# Patient Record
Sex: Female | Born: 1955
Health system: Southern US, Community
[De-identification: ages and names within clinical notes are randomized; demographics above are authoritative.]

## PROBLEM LIST (undated history)

## (undated) DIAGNOSIS — E785 Hyperlipidemia, unspecified: Secondary | ICD-10-CM

## (undated) DIAGNOSIS — N200 Calculus of kidney: Secondary | ICD-10-CM

## (undated) DIAGNOSIS — R9389 Abnormal findings on diagnostic imaging of other specified body structures: Secondary | ICD-10-CM

## (undated) DIAGNOSIS — F32A Depression, unspecified: Secondary | ICD-10-CM

## (undated) DIAGNOSIS — I10 Essential (primary) hypertension: Secondary | ICD-10-CM

## (undated) DIAGNOSIS — R5383 Other fatigue: Secondary | ICD-10-CM

## (undated) DIAGNOSIS — R0683 Snoring: Secondary | ICD-10-CM

## (undated) DIAGNOSIS — D251 Intramural leiomyoma of uterus: Principal | ICD-10-CM

## (undated) DIAGNOSIS — E119 Type 2 diabetes mellitus without complications: Secondary | ICD-10-CM

## (undated) DIAGNOSIS — F329 Major depressive disorder, single episode, unspecified: Secondary | ICD-10-CM

## (undated) HISTORY — DX: Major depressive disorder, single episode, unspecified: F32.9

## (undated) HISTORY — DX: Abnormal findings on diagnostic imaging of other specified body structures: R93.89

## (undated) HISTORY — PX: FOOT SURGERY: SHX648

## (undated) HISTORY — DX: Hyperlipidemia, unspecified: E78.5

## (undated) HISTORY — DX: Snoring: R06.83

## (undated) HISTORY — DX: Intramural leiomyoma of uterus: D25.1

## (undated) HISTORY — DX: Calculus of kidney: N20.0

## (undated) HISTORY — DX: Other fatigue: R53.83

## (undated) HISTORY — DX: Depression, unspecified: F32.A

---

## 2000-12-25 ENCOUNTER — Emergency Department (HOSPITAL_COMMUNITY): Admission: EM | Admit: 2000-12-25 | Discharge: 2000-12-25 | Payer: Self-pay | Admitting: Emergency Medicine

## 2000-12-25 ENCOUNTER — Encounter: Payer: Self-pay | Admitting: Emergency Medicine

## 2002-03-30 ENCOUNTER — Encounter (INDEPENDENT_AMBULATORY_CARE_PROVIDER_SITE_OTHER): Payer: Self-pay | Admitting: *Deleted

## 2002-03-30 LAB — CONVERTED CEMR LAB

## 2002-04-21 ENCOUNTER — Encounter: Admission: RE | Admit: 2002-04-21 | Discharge: 2002-04-21 | Payer: Self-pay | Admitting: Family Medicine

## 2002-04-21 ENCOUNTER — Other Ambulatory Visit: Admission: RE | Admit: 2002-04-21 | Discharge: 2002-04-21 | Payer: Self-pay | Admitting: Sports Medicine

## 2002-04-27 ENCOUNTER — Encounter: Admission: RE | Admit: 2002-04-27 | Discharge: 2002-04-27 | Payer: Self-pay | Admitting: Sports Medicine

## 2002-09-04 ENCOUNTER — Encounter: Admission: RE | Admit: 2002-09-04 | Discharge: 2002-09-04 | Payer: Self-pay | Admitting: Family Medicine

## 2003-02-23 ENCOUNTER — Encounter: Admission: RE | Admit: 2003-02-23 | Discharge: 2003-02-23 | Payer: Self-pay | Admitting: Family Medicine

## 2003-02-26 ENCOUNTER — Ambulatory Visit (HOSPITAL_COMMUNITY): Admission: RE | Admit: 2003-02-26 | Discharge: 2003-02-26 | Payer: Self-pay | Admitting: Family Medicine

## 2003-03-10 ENCOUNTER — Encounter: Admission: RE | Admit: 2003-03-10 | Discharge: 2003-03-10 | Payer: Self-pay | Admitting: Family Medicine

## 2003-03-19 ENCOUNTER — Encounter: Admission: RE | Admit: 2003-03-19 | Discharge: 2003-03-19 | Payer: Self-pay | Admitting: Sports Medicine

## 2003-03-22 ENCOUNTER — Encounter: Admission: RE | Admit: 2003-03-22 | Discharge: 2003-03-22 | Payer: Self-pay | Admitting: Sports Medicine

## 2003-07-28 ENCOUNTER — Encounter: Admission: RE | Admit: 2003-07-28 | Discharge: 2003-07-28 | Payer: Self-pay | Admitting: Family Medicine

## 2004-03-08 ENCOUNTER — Encounter: Admission: RE | Admit: 2004-03-08 | Discharge: 2004-03-08 | Payer: Self-pay | Admitting: Family Medicine

## 2004-07-12 ENCOUNTER — Ambulatory Visit: Payer: Self-pay | Admitting: Sports Medicine

## 2004-12-27 ENCOUNTER — Emergency Department (HOSPITAL_COMMUNITY): Admission: EM | Admit: 2004-12-27 | Discharge: 2004-12-27 | Payer: Self-pay | Admitting: Family Medicine

## 2006-09-26 DIAGNOSIS — E669 Obesity, unspecified: Secondary | ICD-10-CM | POA: Insufficient documentation

## 2006-09-27 ENCOUNTER — Encounter (INDEPENDENT_AMBULATORY_CARE_PROVIDER_SITE_OTHER): Payer: Self-pay | Admitting: *Deleted

## 2007-03-12 ENCOUNTER — Ambulatory Visit (HOSPITAL_BASED_OUTPATIENT_CLINIC_OR_DEPARTMENT_OTHER): Admission: RE | Admit: 2007-03-12 | Discharge: 2007-03-12 | Payer: Self-pay | Admitting: Orthopedic Surgery

## 2008-01-10 ENCOUNTER — Emergency Department (HOSPITAL_COMMUNITY): Admission: EM | Admit: 2008-01-10 | Discharge: 2008-01-10 | Payer: Self-pay | Admitting: Emergency Medicine

## 2010-12-12 NOTE — Op Note (Signed)
[name], [name]               ACCOUNT NO.:  [id number]   MEDICAL RECORD NO.:  [id number]          PATIENT TYPE:  AMB   LOCATION:  DSC                          FACILITY:  [facility]   PHYSICIAN:  Leonides Grills, M.D.     DATE OF BIRTH:  [birth year]   DATE OF PROCEDURE:  03/12/2007  DATE OF DISCHARGE:                               OPERATIVE REPORT   PREOPERATIVE DIAGNOSES:  1. Left ankle impingement.  2. Left tight gastroc.   POSTOPERATIVE DIAGNOSES:  1. Left ankle impingement.  2. Left tight gastroc.   OPERATION:  1. Left ankle arthroscopy with extensive debridement.  2. Left gastroc slide.   ANESTHESIA:  General.   SURGEON:  Leonides Grills, MD   ASSISTANT:  Evlyn Kanner, P.A.   ESTIMATED BLOOD LOSS:  Minimal.   TOURNIQUET:  None.   COMPLICATIONS:  None.   DISPOSITION:  Stable to the PR.   INDICATIONS:  This is a 55 year old female who had a work-related injury  and required the above surgery.  All risks which include infection,  nerve vessel injury, persistent pain, worse pain, prolonged recovery,  stiffness, arthritis, sinus formation, synovial cyst formation were all  explained questions encouraged answered.  We did not perform any  forefoot surgery at this point due to the fact that it was not covered  by the workman's comp carrier and the patient did not want to proceed  with it if it was not covered by the workman's comp carrier.   OPERATION:  The patient brought to the operating room, placed in supine  position after adequate general endotracheal tube anesthesia was  administered as well as Ancef 1 gram IV piggyback.  The patient was then  placed in a sloppy lateral position with the operative side up on a  beanbag.  All bony prominences well padded.  Left lower extremity was  prepped and draped in sterile manner.  No tourniquet was used.  A  longitudinal incision over the medial aspect of the gastrocnemius muscle  tendinous junction was then made.  Dissection  was carried down through  skin.  Hemostasis was obtained.  Fascia was opened in line with the  incision.  Conjoint region was then developed between the gastroc and  soleus.  Soft tissue was then elevated off the posterior aspect the  gastrocnemius.  Sural nerve was identified and protected posteriorly.  Gastrocnemius was then released with curved Mayo scissors, this had  excellent release tight gastroc.  Area was copiously irrigated normal  saline.  Subcu was closed 3-0 Vicryl.  The skin was closed 4-0 Monocryl  subcuticular stitch.  Steri-Strips were applied.  We then mapped out the  anatomical landmarks to include anterior tibialis and peroneus tertius.  Superficial peroneal nerve could not be seen.  Spinal needle was then  placed into the ankle and 20 mL of normal saline instilled into the  ankle.  Weston Brass and spread technique was then utilized to create the  anteromedial portal medial to the anterior tibialis tendon.  Blunt tip  trocar with cannula followed by camera was then placed into the ankle  and  under direct visualization anterolateral portal was created lateral  to peroneus tertius tendon illuminating the skin to create interval  where the superficial peroneal nerve could not be seen.  Again this was  done with a spinal needle followed by nick spread technique.  There was  a large amount of synovitis anterolaterally.  The accessory tib-fib  ligament was impinging against the talar dome. An extensive debridement  was then performed with a radiofrequency bevel removing not only the  accessory tib-fib ligament but also the synovitis that extended into the  lateral gutter as well.  Pictures were obtained.  We then placed a  camera anterolaterally, visualizing anteromedially.  There was a small  amount of synovitis in this area and moderate extending into the gutter  region.  Again this was also extensively debrided ranging the ankle,  there was no impinging areas, i.e. deep deltoid  ligament.  There was no  osteochondral lesions that were obvious. Pictures were obtained as well.  Camera was removed.  Wound was closed 4-0 nylon stitch.  Sterile  dressing was applied, Cam walker boot was applied.  The patient was  stable to PR.      Leonides Grills, M.D.  Electronically Signed     PB/MEDQ  D:  03/12/2007  T:  03/13/2007  Job:  [id number]

## 2011-04-26 LAB — CBC
HCT: 37
Hemoglobin: 12.5
Hemoglobin: 12.5
MCHC: 33.9
MCHC: 34.3
MCV: 83.2
MCV: 83.7
Platelets: 239
RBC: 4.42
RDW: 13.7
RDW: 14.2
WBC: 10.7 — ABNORMAL HIGH

## 2011-04-26 LAB — DIFFERENTIAL
Basophils Absolute: 0
Basophils Relative: 0
Eosinophils Absolute: 0
Monocytes Absolute: 1
Neutro Abs: 8.4 — ABNORMAL HIGH

## 2011-04-26 LAB — BASIC METABOLIC PANEL
BUN: 6
CO2: 24
Calcium: 9.2
Chloride: 103
Creatinine, Ser: 1.06
GFR calc Af Amer: >60
GFR calc non Af Amer: 54 — ABNORMAL LOW
Glucose, Bld: 170 — ABNORMAL HIGH
Potassium: 3.1 — ABNORMAL LOW
Sodium: 133 — ABNORMAL LOW

## 2011-04-26 LAB — POCT CARDIAC MARKERS: CKMB, poc: 1.2

## 2011-04-28 ENCOUNTER — Emergency Department (HOSPITAL_COMMUNITY): Payer: Self-pay

## 2011-04-28 ENCOUNTER — Encounter: Payer: Self-pay | Admitting: *Deleted

## 2011-04-28 ENCOUNTER — Emergency Department (HOSPITAL_COMMUNITY)
Admission: EM | Admit: 2011-04-28 | Discharge: 2011-04-28 | Disposition: A | Discharge status: Home / Self Care | Payer: Self-pay | Attending: Emergency Medicine | Admitting: Emergency Medicine

## 2011-04-28 DIAGNOSIS — S99929A Unspecified injury of unspecified foot, initial encounter: Secondary | ICD-10-CM | POA: Insufficient documentation

## 2011-04-28 DIAGNOSIS — S9031XA Contusion of right foot, initial encounter: Secondary | ICD-10-CM

## 2011-04-28 DIAGNOSIS — S8990XA Unspecified injury of unspecified lower leg, initial encounter: Secondary | ICD-10-CM | POA: Insufficient documentation

## 2011-04-28 DIAGNOSIS — W2209XA Striking against other stationary object, initial encounter: Secondary | ICD-10-CM | POA: Insufficient documentation

## 2011-04-28 DIAGNOSIS — S9030XA Contusion of unspecified foot, initial encounter: Secondary | ICD-10-CM | POA: Insufficient documentation

## 2011-04-28 DIAGNOSIS — T148XXA Other injury of unspecified body region, initial encounter: Secondary | ICD-10-CM

## 2011-04-28 HISTORY — DX: Essential (primary) hypertension: I10

## 2011-04-28 MED ORDER — BACITRACIN ZINC 500 UNIT/GM EX OINT
TOPICAL_OINTMENT | CUTANEOUS | Status: AC
  Administered 2011-04-28: 19:00:00
  Filled 2011-04-28: qty 0.9

## 2011-04-28 MED ORDER — HYDROCODONE-ACETAMINOPHEN 5-325 MG PO TABS
1.0000 | ORAL_TABLET | Freq: Once | ORAL | Status: AC
  Administered 2011-04-28: 1 via ORAL
  Filled 2011-04-28: qty 1

## 2011-04-28 MED ORDER — HYDROCODONE-ACETAMINOPHEN 5-325 MG PO TABS: ORAL_TABLET | ORAL | Status: DC

## 2011-04-28 MED ORDER — TETANUS-DIPHTHERIA TOXOIDS TD 5-2 LFU IM INJ
INJECTION | INTRAMUSCULAR | Status: AC
  Administered 2011-04-28: 0.5 mL via INTRAMUSCULAR
  Filled 2011-04-28: qty 0.5

## 2011-04-28 MED ORDER — IBUPROFEN 800 MG PO TABS
800.0000 mg | ORAL_TABLET | Freq: Once | ORAL | Status: AC
  Administered 2011-04-28: 800 mg via ORAL
  Filled 2011-04-28: qty 1

## 2011-04-28 NOTE — ED Notes (Signed)
Wound cleaned, and gauze applied.  Pt provided with crutches and teaching given.  Return demonstration of proper use.  Denies any questions.

## 2011-04-28 NOTE — ED Provider Notes (Signed)
History     CSN: [id number] Arrival date & time: 04/28/2011  4:51 PM  Chief Complaint  Patient presents with  . Foot Injury    (Consider location/radiation/quality/duration/timing/severity/associated sxs/prior treatment) HPI Comments: Was walking down her hallway this AM and the heater vent "popped up" and she struck her R foot on it.  She worked all day and walked on it.  She went somewhere after work and after sitting for a while has been unable to  [employer] any weight.  Patient is a 55 y.o. female presenting with foot injury. The history is provided by the patient. No language interpreter was used.  Foot Injury  The incident occurred 6 to 12 hours ago. The incident occurred at home. The injury mechanism was a direct blow. The pain is present in the right foot. The quality of the pain is described as throbbing. The pain is moderate. The pain has been constant since onset. Associated symptoms comments: Pain and swelling.  Superficial break in skin.. She reports no foreign bodies present. The symptoms are aggravated by bearing weight and palpation. She has tried nothing for the symptoms.    Past Medical History  Diagnosis Date  . Hypertension     Past Surgical History  Procedure Date  . Cesarean section   . Foot surgery     History reviewed. No pertinent family history.  History  Substance Use Topics  . Smoking status: Never Smoker   . Smokeless tobacco: Not on file  . Alcohol Use: No    OB History    Grav Para Term Preterm Abortions TAB SAB Ect Mult Living                  Review of Systems  Skin: Positive for wound.       Swelling, pain and PT to foot.  All other systems reviewed and are negative.    Allergies  Review of patient's allergies indicates no known allergies.  Home Medications  No current outpatient prescriptions on file.  BP 154/90  Pulse 73  Temp(Src) 98.6 F (37 C) (Oral)  Resp 16  SpO2 100%  Physical Exam  Nursing note and vitals  reviewed. Constitutional: She is oriented to person, place, and time. Vital signs are normal. She appears well-developed and well-nourished. No distress.  HENT:  Head: Normocephalic and atraumatic.  Right Ear: External ear normal.  Left Ear: External ear normal.  Mouth/Throat: No oropharyngeal exudate.  Eyes: Conjunctivae and EOM are normal. Pupils are equal, round, and reactive to light. Right eye exhibits no discharge. Left eye exhibits no discharge. No scleral icterus.  Neck: Normal range of motion. Neck supple. No JVD present. No tracheal deviation present. No thyromegaly present.  Cardiovascular: Normal rate, regular rhythm, intact distal pulses and normal pulses.  Exam reveals friction rub.   Pulmonary/Chest: Effort normal and breath sounds normal. No respiratory distress. She has no wheezes. She has no rales. She exhibits no tenderness.  Abdominal: Soft. Normal appearance and bowel sounds are normal. She exhibits no distension and no mass. There is no tenderness. There is no rebound and no guarding.  Musculoskeletal: She exhibits no edema and no tenderness.       Right foot: She exhibits decreased range of motion, tenderness, bony tenderness, swelling and laceration. She exhibits normal capillary refill, no crepitus and no deformity.       Feet:  Lymphadenopathy:    She has no cervical adenopathy.  Neurological: She is alert and oriented to person, place, and  time. She has normal reflexes. Coordination normal. GCS eye subscore is 4. GCS verbal subscore is 5. GCS motor subscore is 6.  Skin: Skin is warm and dry. No rash noted. She is not diaphoretic.  Psychiatric: She has a normal mood and affect. Her speech is normal and behavior is normal. Judgment and thought content normal. Cognition and memory are normal.    ED Course  Procedures (including critical care time)  Labs Reviewed - No data to display Dg Foot Complete Right  04/28/2011  *RADIOLOGY REPORT*  Clinical Data: Pain post  injury  RIGHT FOOT COMPLETE - 3+ VIEW  Comparison: None.  Findings: Three views of the right foot submitted.  No acute fracture or subluxation.  Soft tissue swelling noted dorsal metatarsal region.  IMPRESSION: No acute fracture or subluxation.  Soft tissue swelling dorsally.  Original Report Authenticated By: Natasha Mead, M.D.     No diagnosis found.    MDM          Worthy Rancher, PA 04/28/11 1907

## 2011-04-28 NOTE — ED Notes (Signed)
Pt c/o pain to right foot. Pt states she cut her foot on floor vent in her house.

## 2011-04-28 NOTE — ED Notes (Signed)
Pt alert and oriented x 3. Skin warm and dry. Color pink. Pt has swelling to the top of her right foot. Small abrasion noted on top of foot. No bleeding at this time. Ice pack to right foot. Right foot elevated on pillow.

## 2011-04-28 NOTE — ED Notes (Signed)
Pt reports continued pain in foot.  Ice pack applied, foot elevated.  Small scrape noted on top of foot. Mild swelling noted. No distress noted.  Warm blanket given.  Pt denies additional needs at present time.

## 2011-04-29 NOTE — ED Provider Notes (Signed)
Medical screening examination/treatment/procedure(s) were performed by non-physician practitioner and as supervising physician I was immediately available for consultation/collaboration.  Flint Melter, MD 04/29/11 Lyda Jester

## 2012-04-17 DIAGNOSIS — R42 Dizziness and giddiness: Secondary | ICD-10-CM

## 2013-12-11 ENCOUNTER — Emergency Department (HOSPITAL_COMMUNITY)
Admission: EM | Admit: 2013-12-11 | Discharge: 2013-12-11 | Disposition: A | Discharge status: Home / Self Care | Payer: BC Managed Care – PPO | Attending: Emergency Medicine | Admitting: Emergency Medicine

## 2013-12-11 ENCOUNTER — Encounter (HOSPITAL_COMMUNITY): Payer: Self-pay | Admitting: Emergency Medicine

## 2013-12-11 DIAGNOSIS — I10 Essential (primary) hypertension: Secondary | ICD-10-CM | POA: Insufficient documentation

## 2013-12-11 DIAGNOSIS — E119 Type 2 diabetes mellitus without complications: Secondary | ICD-10-CM | POA: Insufficient documentation

## 2013-12-11 DIAGNOSIS — W1809XA Striking against other object with subsequent fall, initial encounter: Secondary | ICD-10-CM | POA: Insufficient documentation

## 2013-12-11 DIAGNOSIS — S1093XA Contusion of unspecified part of neck, initial encounter: Principal | ICD-10-CM

## 2013-12-11 DIAGNOSIS — Y939 Activity, unspecified: Secondary | ICD-10-CM | POA: Insufficient documentation

## 2013-12-11 DIAGNOSIS — S0003XA Contusion of scalp, initial encounter: Secondary | ICD-10-CM | POA: Insufficient documentation

## 2013-12-11 DIAGNOSIS — Y929 Unspecified place or not applicable: Secondary | ICD-10-CM | POA: Insufficient documentation

## 2013-12-11 DIAGNOSIS — S0083XA Contusion of other part of head, initial encounter: Secondary | ICD-10-CM | POA: Insufficient documentation

## 2013-12-11 HISTORY — DX: Type 2 diabetes mellitus without complications: E11.9

## 2013-12-11 LAB — CBG MONITORING, ED: Glucose-Capillary: 163 mg/dL — ABNORMAL HIGH (ref 70–99)

## 2013-12-11 MED ORDER — HYDROCODONE-ACETAMINOPHEN 5-325 MG PO TABS
1.0000 | ORAL_TABLET | ORAL | Status: DC | PRN
Start: 2013-12-11 — End: 2014-11-29

## 2013-12-11 NOTE — Discharge Instructions (Signed)
He examination is negative for acute changes at this time. Your blood pressure is slightly elevated. Please have this rechecked by your primary provider. Please use Tylenol for mild headache pain, use Norco for more severe pain or soreness. Please return to the emergency department if any unusual changes, problems, or concerns. Facial or Scalp Contusion  A facial or scalp contusion is a deep bruise on the face or head. Contusions happen when an injury causes bleeding under the skin. Signs of bruising include pain, puffiness (swelling), and discolored skin. The contusion may turn blue, purple, or yellow. HOME CARE  Only take medicines as told by your doctor.  Put ice on the injured area.  Put ice in a plastic bag.  Place a towel between your skin and the bag.  Leave the ice on for 20 minutes, 2 3 times a day. GET HELP IF:  You have bite problems.  You have pain when chewing.  You are worried about your face not healing normally. GET HELP RIGHT AWAY IF:   You have severe pain or a headache and medicine does not help.  You are very tired or confused, or your personality changes.  You throw up (vomit).  You have a nosebleed that will not stop.  You see two of everything (double vision) or have blurry vision.  You have fluid coming from your nose or ear.  You have problems walking or using your arms or legs. MAKE SURE YOU:   Understand these instructions.  Will watch your condition.  Will get help right away if you are not doing well or get worse. Document Released: 07/05/2011 Document Revised: 05/06/2013 Document Reviewed: 02/26/2013 [facility] Patient Information 2014 [place], [place].

## 2013-12-11 NOTE — ED Provider Notes (Signed)
CSN: [id number]     Arrival date & time 12/11/13  0801 History   First MD Initiated Contact with Patient 12/11/13 [id number]     Chief Complaint  Patient presents with  . Fall     (Consider location/radiation/quality/duration/timing/severity/associated sxs/prior Treatment) HPI Comments: Patient is a 58 year old female who presents to the emergency department with a complaint of headache and feeling funny. The patient states that 2 nights ago she fell off of aching size bed and hit the floor. The patient states she did not lose consciousness. She was able to get up and walk around after the incident. She has had however a" light headache" since the accident. She states today she had headache, sensation of feeling funny, and some pain when she is attempting to chew on the right side. She became concerned for possible stroke or other problems and came to the emergency department. The patient has not had any loss of bowel or bladder function. She has not had any difficulty with her walking. She does state however that at times she feels as though she may be a little bit off balance. She denies being on any anticoagulation medications. There is no history of any bleeding disorders. She has taken Tylenol for headache, but these have not been successful.  Patient is a 58 y.o. female presenting with fall. The history is provided by the patient.  Fall Associated symptoms include headaches. Pertinent negatives include no abdominal pain, arthralgias, chest pain, coughing or neck pain.    Past Medical History  Diagnosis Date  . Hypertension   . Diabetes mellitus without complication    Past Surgical History  Procedure Laterality Date  . Cesarean section    . Foot surgery     No family history on file. History  Substance Use Topics  . Smoking status: Never Smoker   . Smokeless tobacco: Not on file  . Alcohol Use: No   OB History   Grav Para Term Preterm Abortions TAB SAB Ect Mult Living                  Review of Systems  Constitutional: Negative for activity change.       All ROS Neg except as noted in HPI  HENT: Positive for facial swelling. Negative for nosebleeds.   Eyes: Negative for photophobia and discharge.  Respiratory: Negative for cough, shortness of breath and wheezing.   Cardiovascular: Negative for chest pain and palpitations.  Gastrointestinal: Negative for abdominal pain and blood in stool.  Genitourinary: Negative for dysuria, frequency and hematuria.  Musculoskeletal: Negative for arthralgias, back pain and neck pain.  Skin: Negative.   Neurological: Positive for light-headedness and headaches. Negative for dizziness, seizures and speech difficulty.  Psychiatric/Behavioral: Negative for hallucinations and confusion.      Allergies  Review of patient's allergies indicates no known allergies.  Home Medications   Prior to Admission medications   Medication Sig Start Date End Date Taking? Authorizing Provider  HYDROcodone-acetaminophen [facility]) 5-325 MG per tablet One po q 4-6 hrs prn pain 04/28/11   Richard Sabra Heck, PA-C   BP 155/84  Pulse 124  Temp(Src) 98 F (36.7 C) (Oral)  Resp 20  Ht 5' (1.524 m)  Wt 210 lb (95.255 kg)  BMI 41.01 kg/m2  SpO2 100% Physical Exam  Nursing note and vitals reviewed. Constitutional: She is oriented to person, place, and time. She appears well-developed and well-nourished.  Non-toxic appearance.  HENT:  Head: Normocephalic.  Right Ear: Tympanic membrane and external ear  normal.  Left Ear: Tympanic membrane and external ear normal.  There is a bruise to the right lateral face. There is no crepitus or deformity noted of the temporal mandibular joints on the right or left. There is no deformity of the mandible.  The oropharynx is clear. There is no trauma to the tongue. There is no chipped teeth. There no loose teeth.  There is no bruise, redness, or tenderness behind the right ear. There is a negative Battle's sign  bilaterally.  Eyes: EOM and lids are normal. Pupils are equal, round, and reactive to light.  Neck: Normal range of motion. Neck supple. Carotid bruit is not present.  Cardiovascular: Normal rate, regular rhythm, normal heart sounds, intact distal pulses and normal pulses.   Pulmonary/Chest: Breath sounds normal. No respiratory distress.  There is symmetrical rise and fall of the chest. The patient speaks in complete sentences.  Abdominal: Soft. Bowel sounds are normal. There is no tenderness. There is no guarding.  Musculoskeletal: Normal range of motion.  Mild to moderate puffiness of the feet and ankles.  Lymphadenopathy:       Head (right side): No submandibular adenopathy present.       Head (left side): No submandibular adenopathy present.    She has no cervical adenopathy.  Neurological: She is alert and oriented to person, place, and time. She has normal strength. No cranial nerve deficit or sensory deficit. She exhibits normal muscle tone. Coordination normal.  No motor or sensory deficits appreciated on examination. Gait is steady and intact.  Speech is clear,  patient can count and reason without problem.  Skin: Skin is warm and dry.  Psychiatric: She has a normal mood and affect. Her speech is normal.    ED Course  Procedures (including critical care time) Labs Review Labs Reviewed - No data to display  Imaging Review No results found.   EKG Interpretation None      MDM Patient sustained a fall from aching size bed 2 nights ago hitting her head on a nightstand and on the floor. There was no loss of consciousness. Since the time of the fall there's been no additional falls. There's been no nausea vomiting,, and there's been no vision changes. The patient has had a sensation of being off balance and lightheaded at times and continues to have problem with some mild headache on the right side.  The examination showed the vital signs to be within normal limits with  exception of the pulse rate being 124 on admission and blood pressure being 155/84 on admission. After the patient got back to the room and settle down her heart rate is now 68-71. The pulse oximetry is 100% on room air. Within normal limits by my interpretation. There no gross neurologic deficits appreciated on the examination. I find no evidence of deformity of the facial bones.  The plan at this time is for the patient to use caution in changing positions. I've also asked the patient to use Tylenol for mild headache pain, to use Norco every 4 hours for more severe headache pain. The patient is also to keep a very close watch on her glucose. She is advised to return to ED if any changes or problem.    Final diagnoses:  None    **I have reviewed nursing notes, vital signs, and all appropriate lab and imaging results for this patient.Lenox Ahr, PA-C 12/12/13 1349

## 2013-12-11 NOTE — ED Notes (Signed)
Pt alert & oriented x4, stable gait. Patient given discharge instructions, paperwork & prescription(s). Patient  instructed to stop at the registration desk to finish any additional paperwork. Patient verbalized understanding. Pt left department w/ no further questions. 

## 2013-12-11 NOTE — ED Notes (Signed)
Pt rolled out of bed x 2 nights ago hitting head on vase.  Denies LOC.  C/o headaches, swelling, and "feeling funny" since.  Reports being off balance.  Ambulatory to room with steady gait.  Also c/o right side of face pain and neck pain.

## 2013-12-17 NOTE — ED Provider Notes (Signed)
Medical screening examination/treatment/procedure(s) were performed by non-physician practitioner and as supervising physician I was immediately available for consultation/collaboration.   EKG Interpretation None        Tanna Furry, MD 12/17/13 0045

## 2014-11-08 ENCOUNTER — Other Ambulatory Visit (HOSPITAL_COMMUNITY): Payer: Self-pay | Admitting: Family Medicine

## 2014-11-08 DIAGNOSIS — Z139 Encounter for screening, unspecified: Secondary | ICD-10-CM

## 2014-11-16 ENCOUNTER — Ambulatory Visit (HOSPITAL_COMMUNITY)
Admission: RE | Admit: 2014-11-16 | Discharge: 2014-11-16 | Disposition: A | Discharge status: Home / Self Care | Payer: BLUE CROSS/BLUE SHIELD | Source: Ambulatory Visit | Attending: Family Medicine | Admitting: Family Medicine

## 2014-11-16 DIAGNOSIS — E559 Vitamin D deficiency, unspecified: Secondary | ICD-10-CM | POA: Insufficient documentation

## 2014-11-16 DIAGNOSIS — Z13820 Encounter for screening for osteoporosis: Secondary | ICD-10-CM | POA: Diagnosis present

## 2014-11-16 DIAGNOSIS — Z139 Encounter for screening, unspecified: Secondary | ICD-10-CM

## 2014-11-29 ENCOUNTER — Telehealth: Payer: Self-pay

## 2014-11-29 NOTE — Telephone Encounter (Signed)
Left message to call us back and let us know if she has had a prior sleep study done and where?

## 2014-11-30 ENCOUNTER — Encounter: Payer: Self-pay | Admitting: Neurology

## 2014-11-30 ENCOUNTER — Ambulatory Visit (INDEPENDENT_AMBULATORY_CARE_PROVIDER_SITE_OTHER): Payer: BLUE CROSS/BLUE SHIELD | Admitting: Neurology

## 2014-11-30 VITALS — BP 143/93 | HR 61 | Resp 16 | Ht 60.0 in | Wt 210.0 lb

## 2014-11-30 DIAGNOSIS — E669 Obesity, unspecified: Secondary | ICD-10-CM | POA: Diagnosis not present

## 2014-11-30 DIAGNOSIS — G4733 Obstructive sleep apnea (adult) (pediatric): Secondary | ICD-10-CM | POA: Diagnosis not present

## 2014-11-30 DIAGNOSIS — R351 Nocturia: Secondary | ICD-10-CM

## 2014-11-30 DIAGNOSIS — G4719 Other hypersomnia: Secondary | ICD-10-CM | POA: Diagnosis not present

## 2014-11-30 NOTE — Progress Notes (Addendum)
Subjective:    Patient ID: Cachet Mccutchen is a 59 y.o. female.  HPI     Star Age, MD, PhD [facility] [address] [place], [place] [address]  Dear Dr. Ethlyn Gallery,   I saw your patient, Monia Timmers, upon your kind request in my neurologic clinic today for initial consultation of her sleep disorder, in particular, concern for underlying obstructive sleep apnea. The patient is accompanied by her husband today. As you know, Ms. [name] is a 59 year old right-handed woman with an underlying medical history of hypertension, vitamin D deficiency, depression and obesity, who is reported to snore, have apneic pauses in her sleep and a sense of gasping at night. She was diagnosed with obstructive sleep apnea in the past, some 7 or 8 years ago. She was given a CPAP machine, but only used it for some weeks. I do not have prior sleep study results available for review. I reviewed your office note from 10/04/14, which you kindly included. She had blood work at your office on 10/04/2014 including CBC with differential, CMP, hemoglobin A1c, lipid panel, TSH, B12 and vitamin D, we will request results of those. She reports that her hemoglobin A1c was 10 point something and she was started on a new diabetes medication. 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 She got married earlier this year and she and her husband adopted their 87 year old foster child. She is expecting to foster children soon. Her husband works third shift. Her main complaint is excessive daytime somnolence. She would be willing to have another sleep study and be treated for obstructive sleep apnea with CPAP machine. She has occasional morning headaches. She has nocturia typically 2-3 times per night. She is trying to lose weight. She has 2 grown biological daughters. Her bedtime is around 11 PM and her rise time varies. She does  not wake up rested. Her Epworth sleepiness score is 12 out of 24 and her fatigue score is 43 today. She is a nonsmoker. She does not drink alcohol. She drinks 3 sodas daily typically and 1 cup of coffee in the mornings. Her Past Medical History Is Significant For: Past Medical History  Diagnosis Date  . Hypertension   . Diabetes mellitus without complication   . Fatigue   . Snoring     Her Past Surgical History Is Significant For: Past Surgical History  Procedure Laterality Date  . Cesarean section    . Foot surgery      Her Family History Is Significant For: Family History  Problem Relation Age of Onset  . Kidney disease Mother   . Hypertension Mother   . Diabetes Mother     Her Social History Is Significant For: History   Social History  . Marital Status: Married    Spouse Name: N/A  . Number of Children: 3  . Years of Education: 12th grd   Occupational History  . [employer] Mother     Social History Main Topics  . Smoking status: Never Smoker   . Smokeless tobacco: Not on file  . Alcohol Use: No  . Drug Use: No  . Sexual Activity: Not on file   Other Topics Concern  . None   Social History Narrative   1 cup of coffee a day, occasional soda consumption     Her Allergies Are:  No Known Allergies:   Her Current Medications Are:  Outpatient Encounter Prescriptions as of 11/30/2014  Medication Sig  . lisinopril (PRINIVIL,ZESTRIL) 2.5 MG tablet Take 2.5 mg by mouth daily.  . metFORMIN (GLUCOPHAGE) 500 MG tablet Take by mouth 2 (two) times daily with a meal.  . ONETOUCH DELICA LANCETS 54Y MISC   . pravastatin (PRAVACHOL) 10 MG tablet Take 10 mg by mouth daily.  . Vitamin D, Ergocalciferol, (DRISDOL) 50000 UNITS CAPS capsule Take 50,000 Units by mouth every 7 (seven) days.  . [DISCONTINUED] FLUoxetine (PROZAC) 20 MG capsule Take 20 mg by mouth daily.   No facility-administered encounter medications on file as of 11/30/2014.  :  Review of Systems:  Out of a  complete 14 point review of systems, all are reviewed and negative with the exception of these symptoms as listed below:  Review of Systems  Constitutional: Positive for fatigue.       Weight gain   HENT: Positive for tinnitus.   Respiratory:       Snoring   Neurological: Positive for headaches.       Snoring, Insomnia, sometimes finds it is hard to fall asleep, wakes up many times during the night, witnessed apnea, daytime sleepiness, takes naps during the day, wakes up in the morning feeling tired.   Psychiatric/Behavioral:       Decreased energy     Objective:  Neurologic Exam  Physical Exam Physical Examination:   Filed Vitals:   11/30/14 1026  BP: 143/93  Pulse: 61  Resp: 16    General Examination: The patient is a very pleasant 59 y.o. female in no acute distress. She appears well-developed and well-nourished and very well groomed.   HEENT: Normocephalic, atraumatic, pupils are equal, round and reactive to light and accommodation. Funduscopic exam is normal with sharp disc margins noted. Extraocular tracking is good without limitation to gaze excursion or nystagmus noted. Normal smooth pursuit is noted. Hearing is grossly intact. Tympanic membranes are clear bilaterally. Face is symmetric with normal facial animation and normal facial sensation. Speech is clear with no dysarthria noted. There is no hypophonia. There is no lip, neck/head, jaw or voice tremor. Neck is supple with full range of passive and active motion. There are no carotid bruits on auscultation. Oropharynx exam reveals: moderate mouth dryness, adequate dental hygiene and moderate airway crowding, due to thick soft palate, large uvula, thicker tongue and tonsils of 2+ bilaterally. Mallampati is class II. Tongue protrudes centrally and palate elevates symmetrically. Neck size is 15-1/2 inches. She has mild inferior turbinate hypertrophy and mild nasal mucosal redness, otherwise no significant swelling or septal  deviation on nasal exam. She has a mild overbite.  Chest: Clear to auscultation without wheezing, rhonchi or crackles noted.  Heart: S1+S2+0, regular and normal without murmurs, rubs or gallops noted.   Abdomen: Soft, non-tender and non-distended with normal bowel sounds appreciated on auscultation.  Extremities: There is trace pitting edema in the distal lower extremities bilaterally. Pedal pulses are intact.  Skin: Warm and dry without trophic changes noted. There are no varicose veins.  Musculoskeletal: exam reveals no obvious joint deformities, tenderness or joint swelling or erythema.   Neurologically:  Mental status: The patient is awake, alert and oriented in all 4 spheres. Her immediate and remote memory, attention, language skills and fund of knowledge are appropriate. There is no evidence of aphasia, agnosia, apraxia or anomia. Speech is clear with normal prosody and enunciation. Thought process is linear. Mood is normal and affect is normal.  Cranial nerves II - XII are as  described above under HEENT exam. In addition: shoulder shrug is normal with equal shoulder height noted. Motor exam: Normal bulk, strength and tone is noted. There is no drift, tremor or rebound. Romberg is negative. Reflexes are 2+ throughout. Babinski: Toes are flexor bilaterally. Fine motor skills and coordination: intact with normal finger taps, normal hand movements, normal rapid alternating patting, normal foot taps and normal foot agility.  Cerebellar testing: No dysmetria or intention tremor on finger to nose testing. Heel to shin is unremarkable bilaterally. There is no truncal or gait ataxia.  Sensory exam: intact to light touch, pinprick, vibration, temperature sense in the upper and lower extremities.  Gait, station and balance: She stands easily. No veering to one side is noted. No leaning to one side is noted. Posture is age-appropriate and stance is narrow based. Gait shows normal stride length and  normal pace. No problems turning are noted. She turns en bloc. Tandem walk is unremarkable.  Assessment and Plan:   In summary, Sherial [name] is a very pleasant 59 y.o.-year old female with an underlying medical history of hypertension, vitamin D deficiency, depression and obesity, who was diagnosed with obstructive sleep apnea several years ago. Results are not available for review today but we will try to request those. She was given a CPAP machine and used it perhaps for a few weeks. She would be willing to get re-diagnosed and treated for sleep apnea because she does report significant daytime somnolence and sleep disruption. Her history and physical exam are in keeping with the diagnosis of obstructive sleep apnea (OSA). I had a long chat with the patient and her husband about my findings and the diagnosis of OSA, its prognosis and treatment options. We talked about medical treatments, surgical interventions and non-pharmacological approaches. I explained in particular the risks and ramifications of untreated moderate to severe OSA, especially with respect to developing cardiovascular disease down the Road, including congestive heart failure, difficult to treat hypertension, cardiac arrhythmias, or stroke. Even type 2 diabetes has, in part, been linked to untreated OSA. Symptoms of untreated OSA include daytime sleepiness, memory problems, mood irritability and mood disorder such as depression and anxiety, lack of energy, as well as recurrent headaches, especially morning headaches. We talked about trying to maintain a healthy lifestyle in general, as well as the importance of weight control. I encouraged the patient to eat healthy, exercise daily and keep well hydrated, to keep a scheduled bedtime and wake time routine, to not skip any meals and eat healthy snacks in between meals. I advised the patient not to drive when feeling sleepy. She is advised to reduce her caffeine intake and increase her  water intake.  I recommended the following at this time: sleep study with potential positive airway pressure titration. (We will score hypopneas at 3% and split the sleep study into diagnostic and treatment portion, if the estimated. 2 hour AHI is >15/h).   I explained the sleep test procedure to the patient and also outlined possible surgical and non-surgical treatment options of OSA, including the use of a custom-made dental device (which would require a referral to a specialist dentist or oral surgeon), upper airway surgical options, such as pillar implants, radiofrequency surgery, tongue base surgery, and UPPP (which would involve a referral to an ENT surgeon). Rarely, jaw surgery such as mandibular advancement may be considered.  I also explained the CPAP treatment option to the patient, who indicated that she would be willing to try CPAP if the need arises.  I explained the importance of being compliant with PAP treatment, not only for insurance purposes but primarily to improve Her symptoms, and for the patient's long term health benefit, including to reduce Her cardiovascular risks. I answered all their questions today and the patient and her husband were in agreement. I would like to see her back after the sleep study is completed and encouraged her to call with any interim questions, concerns, problems or updates.   Thank you very much for allowing me to participate in the care of this nice patient. If I can be of any further assistance to you please do not hesitate to call me at [phone].  Sincerely,   Star Age, MD, PhD  12/01/2014:  I received laboratory test results from her primary care physician office and I reviewed those: This was from 11/08/2014: CBC with differential was normal, CMP was unremarkable with the exception of borderline glucose at 113, hemoglobin A1c was 8.1, HSV IgM and IgG unremarkable, total cholesterol 184, LDL 101, HIV nonreactive, RPR nonreactive, TSH normal,  urinalysis normal.

## 2014-11-30 NOTE — Patient Instructions (Signed)

## 2015-01-03 ENCOUNTER — Ambulatory Visit (HOSPITAL_COMMUNITY)
Admission: RE | Admit: 2015-01-03 | Discharge: 2015-01-03 | Disposition: A | Discharge status: Home / Self Care | Payer: BLUE CROSS/BLUE SHIELD | Source: Ambulatory Visit | Attending: Family Medicine | Admitting: Family Medicine

## 2015-01-03 DIAGNOSIS — Z1231 Encounter for screening mammogram for malignant neoplasm of breast: Secondary | ICD-10-CM | POA: Diagnosis present

## 2015-01-03 DIAGNOSIS — Z139 Encounter for screening, unspecified: Secondary | ICD-10-CM

## 2016-01-15 ENCOUNTER — Emergency Department (HOSPITAL_COMMUNITY): Payer: BLUE CROSS/BLUE SHIELD

## 2016-01-15 ENCOUNTER — Emergency Department (HOSPITAL_COMMUNITY)
Admission: EM | Admit: 2016-01-15 | Discharge: 2016-01-15 | Disposition: A | Discharge status: Home / Self Care | Payer: BLUE CROSS/BLUE SHIELD | Attending: Emergency Medicine | Admitting: Emergency Medicine

## 2016-01-15 ENCOUNTER — Encounter (HOSPITAL_COMMUNITY): Payer: Self-pay | Admitting: Emergency Medicine

## 2016-01-15 DIAGNOSIS — S6991XA Unspecified injury of right wrist, hand and finger(s), initial encounter: Secondary | ICD-10-CM | POA: Diagnosis present

## 2016-01-15 DIAGNOSIS — Y929 Unspecified place or not applicable: Secondary | ICD-10-CM | POA: Diagnosis not present

## 2016-01-15 DIAGNOSIS — L03011 Cellulitis of right finger: Secondary | ICD-10-CM | POA: Diagnosis not present

## 2016-01-15 DIAGNOSIS — Z79899 Other long term (current) drug therapy: Secondary | ICD-10-CM | POA: Diagnosis not present

## 2016-01-15 DIAGNOSIS — E119 Type 2 diabetes mellitus without complications: Secondary | ICD-10-CM | POA: Insufficient documentation

## 2016-01-15 DIAGNOSIS — X58XXXA Exposure to other specified factors, initial encounter: Secondary | ICD-10-CM | POA: Diagnosis not present

## 2016-01-15 DIAGNOSIS — Z7984 Long term (current) use of oral hypoglycemic drugs: Secondary | ICD-10-CM | POA: Insufficient documentation

## 2016-01-15 DIAGNOSIS — Y999 Unspecified external cause status: Secondary | ICD-10-CM | POA: Diagnosis not present

## 2016-01-15 DIAGNOSIS — I10 Essential (primary) hypertension: Secondary | ICD-10-CM | POA: Insufficient documentation

## 2016-01-15 DIAGNOSIS — Y939 Activity, unspecified: Secondary | ICD-10-CM | POA: Insufficient documentation

## 2016-01-15 LAB — BASIC METABOLIC PANEL
ANION GAP: 8 (ref 5–15)
BUN: 14 mg/dL (ref 6–20)
CALCIUM: 9.5 mg/dL (ref 8.9–10.3)
CO2: 23 mmol/L (ref 22–32)
CREATININE: 1.22 mg/dL — AB (ref 0.44–1.00)
Chloride: 105 mmol/L (ref 101–111)
GFR calc non Af Amer: 47 mL/min — ABNORMAL LOW (ref >60)
GFR, EST AFRICAN AMERICAN: 54 mL/min — AB (ref >60)
Glucose, Bld: 246 mg/dL — ABNORMAL HIGH (ref 65–99)
Potassium: 3.7 mmol/L (ref 3.5–5.1)
SODIUM: 136 mmol/L (ref 135–145)

## 2016-01-15 LAB — CBC WITH DIFFERENTIAL/PLATELET
BASOS ABS: 0 10*3/uL (ref 0.0–0.1)
BASOS PCT: 0 %
EOS ABS: 0.1 10*3/uL (ref 0.0–0.7)
Eosinophils Relative: 1 %
HEMATOCRIT: 38 % (ref 36.0–46.0)
HEMOGLOBIN: 12.5 g/dL (ref 12.0–15.0)
Lymphocytes Relative: 15 %
Lymphs Abs: 1.3 10*3/uL (ref 0.7–4.0)
MCH: 27.4 pg (ref 26.0–34.0)
MCHC: 32.9 g/dL (ref 30.0–36.0)
MCV: 83.2 fL (ref 78.0–100.0)
MONO ABS: 0.6 10*3/uL (ref 0.1–1.0)
MONOS PCT: 7 %
NEUTROS ABS: 6.6 10*3/uL (ref 1.7–7.7)
NEUTROS PCT: 77 %
Platelets: 275 10*3/uL (ref 150–400)
RBC: 4.57 MIL/uL (ref 3.87–5.11)
RDW: 13.5 % (ref 11.5–15.5)
WBC: 8.5 10*3/uL (ref 4.0–10.5)

## 2016-01-15 MED ORDER — TETANUS-DIPHTH-ACELL PERTUSSIS 5-2.5-18.5 LF-MCG/0.5 IM SUSP
INTRAMUSCULAR | Status: AC
  Filled 2016-01-15: qty 0.5

## 2016-01-15 MED ORDER — TETANUS-DIPHTH-ACELL PERTUSSIS 5-2.5-18.5 LF-MCG/0.5 IM SUSP
0.5000 mL | Freq: Once | INTRAMUSCULAR | Status: AC
  Administered 2016-01-15: 0.5 mL via INTRAMUSCULAR
  Filled 2016-01-15: qty 0.5

## 2016-01-15 MED ORDER — BUPIVACAINE HCL (PF) 0.25 % IJ SOLN
10.0000 mL | Freq: Once | INTRAMUSCULAR | Status: DC
  Filled 2016-01-15: qty 30

## 2016-01-15 MED ORDER — CEPHALEXIN 500 MG PO CAPS
500.0000 mg | ORAL_CAPSULE | Freq: Once | ORAL | Status: AC
  Administered 2016-01-15: 500 mg via ORAL
  Filled 2016-01-15: qty 1

## 2016-01-15 MED ORDER — CEPHALEXIN 500 MG PO CAPS: 500.0000 mg | ORAL_CAPSULE | Freq: Four times a day (QID) | ORAL | Status: DC

## 2016-01-15 MED ORDER — LIDOCAINE HCL (PF) 1 % IJ SOLN
5.0000 mL | Freq: Once | INTRAMUSCULAR | Status: DC
  Filled 2016-01-15: qty 5

## 2016-01-15 NOTE — ED Provider Notes (Signed)
CSN: [id number]     Arrival date & time 01/15/16  1635 History   By signing my name below, I, Jasmyn B. Alexander, attest that this documentation has been prepared under the direction and in the presence of [facility] M. Janit Bern, NP.  Electronically Signed: Tedra Coupe. Sheppard Coil, ED Scribe. 01/15/2016. 5:57 PM.    Chief Complaint  Patient presents with  . Finger Injury    The history is provided by the patient. No language interpreter was used.   HPI Comments: [name] is a 60 y.o. female with PMHx of DM and HTN who presents to the Emergency Department complaining of gradual onset, constant, worsening swelling and pain of right thumb onset 5 days PTA. Pt reports she noticed mild swelling of thumb after receiving a manicure 5 days ago. She states finger continued to swell and she went to Urgent Care on 01/13/16. She states she was diagnosed with an infection of her thumb and was prescribed Bactrim. She has associated pain radiating down right arm. There are no modifying factors. She states her blood sugar has been normal but she has not checked it today. She states prior to coming to [facility], she went to a [employer].   Past Medical History  Diagnosis Date  . Hypertension   . Diabetes mellitus without complication ([place])   . Fatigue   . Snoring    Past Surgical History  Procedure Laterality Date  . Cesarean section    . Foot surgery     Family History  Problem Relation Age of Onset  . Kidney disease Mother   . Hypertension Mother   . Diabetes Mother    Social History  Substance Use Topics  . Smoking status: Never Smoker   . Smokeless tobacco: None  . Alcohol Use: No   OB History    No data available     Review of Systems  Constitutional: Negative for fever.  Musculoskeletal: Positive for myalgias, joint swelling and arthralgias.   Allergies  Review of patient's allergies indicates no known allergies.  Home Medications   Prior to Admission medications    Medication Sig Start Date End Date Taking? Authorizing Provider  cephALEXin (KEFLEX) 500 MG capsule Take 1 capsule (500 mg total) by mouth 4 (four) times daily. 01/15/16   Belenda Alviar Bunnie Pion, NP  lisinopril (PRINIVIL,ZESTRIL) 2.5 MG tablet Take 2.5 mg by mouth daily.    Historical Provider, MD  metFORMIN (GLUCOPHAGE) 500 MG tablet Take by mouth 2 (two) times daily with a meal.    Historical Provider, MD  Jonetta Speak LANCETS [id number] [place]  10/05/14   Historical Provider, MD  pravastatin (PRAVACHOL) 10 MG tablet Take 10 mg by mouth daily.    Historical Provider, MD  Vitamin D, Ergocalciferol, (DRISDOL) 50000 UNITS CAPS capsule Take 50,000 Units by mouth every 7 (seven) days.    Historical Provider, MD   BP 129/80 mmHg  Pulse 92  Temp(Src) 98.4 F (36.9 C) (Oral)  Resp 20  Wt 95.255 kg  SpO2 98% Physical Exam  Constitutional: She is oriented to person, place, and time. She appears well-developed and well-nourished.  HENT:  Head: Normocephalic and atraumatic.  Eyes: EOM are normal. Pupils are equal, round, and reactive to light.  Neck: Normal range of motion.  Cardiovascular: Normal rate, regular rhythm and normal heart sounds.   Radial pulses 2+  Pulmonary/Chest: Effort normal and breath sounds normal. No respiratory distress. She has no wheezes. She has no rales.  Abdominal: Soft. Bowel sounds are normal.  She exhibits no distension. There is no tenderness. There is no rebound and no guarding.  Musculoskeletal: Normal range of motion. She exhibits edema and tenderness.  Swelling to distal aspect of right thumb. Tender to the entire distal aspect of right thumb. Paronychia of right thumb noted radial aspect.  Neurological: She is alert and oriented to person, place, and time.  Skin: Skin is warm and dry. No rash noted. There is erythema (Right thumb).  Infection noted of right thumb  Psychiatric: She has a normal mood and affect. Judgment normal.  Nursing note and vitals reviewed.   ED Course   .Marland KitchenIncision and Drainage Date/Time: 01/15/2016 8:06 PM Performed by: Ashley Murrain Authorized by: Ashley Murrain Consent: Verbal consent obtained. Risks and benefits: risks, benefits and alternatives were discussed Consent given by: patient Patient understanding: patient states understanding of the procedure being performed Imaging studies: imaging studies available Required items: required blood products, implants, devices, and special equipment available Patient identity confirmed: verbally with patient Indications for incision and drainage: paronychia. Body area: upper extremity Location details: right thumb Anesthesia: digital block Local anesthetic: bupivacaine 0.25% without epinephrine and lidocaine 1% without epinephrine Anesthetic total: 4 ml Patient sedated: no Scalpel size: 11 Incision type: single straight Incision depth: subcutaneous Complexity: complex Drainage: purulent Drainage amount: moderate Wound treatment: wound left open Patient tolerance: Patient tolerated the procedure well with no immediate complications Comments: Using sterile scissors and hemostat ingrown nail removed in addition to draining the paronychia. Tetanus updated. Bulky dressing   (including critical care time) DIAGNOSTIC STUDIES: Oxygen Saturation is 98% on RA, normal by my interpretation.    COORDINATION OF CARE: 5:44 PM-Discussed treatment plan which includes CBC and BMP with pt at bedside and pt agreed to plan.   Labs Review Labs Reviewed  BASIC METABOLIC PANEL - Abnormal; Notable for the following:    Glucose, Bld 246 (*)    Creatinine, Ser 1.22 (*)    GFR calc non Af Amer 47 (*)    GFR calc Af Amer 54 (*)    All other components within normal limits  CBC WITH DIFFERENTIAL/PLATELET   7:38 PM Blood sugar elevated, however patient states she ate a large buffet lunch before ED visit. She will continue to closely monitor blood sugar at home.  MDM  60 y.o. female with pain and  swelling of the right thumb stable for d/c without red streaking, fever or focal n/v deficits. Discussed with the patient clinical, x-ray and lab findings and plan of care. All questioned fully answered. She will f/u with her PCP or return here if any problems arise.   Final diagnoses:  Paronychia of thumb, right   I personally performed the services described in this documentation, which was scribed in my presence. The recorded information has been reviewed and is accurate.    [place], NP 01/15/16 2011  Daleen Bo, MD 01/15/16 [place], MD 01/15/16 [phone]

## 2016-01-15 NOTE — ED Notes (Signed)
Pt has infection around her right thumb.  Was started on Bactrim Friday.

## 2016-01-15 NOTE — ED Provider Notes (Signed)
  Face-to-face evaluation   History: She complains of swelling right thumb, for several days. This followed a recent manicure.  Physical exam: Right thumb with radial aspect paronychia. Apparent small ingrown nail distally. Tenderness of the pad of the thumb without evidence for felon.  Medical screening examination/treatment/procedure(s) were conducted as a shared visit with non-physician practitioner(s) and myself.  I personally evaluated the patient during the encounter  Daleen Bo, MD 01/15/16 2340

## 2016-01-15 NOTE — Discharge Instructions (Signed)
Continue to take the antibiotics you have in addition to what we give you today. Follow up with your doctor in 2 days for recheck. Return here sooner for any problems.  Keep a close check on your blood sugar and report to your doctor.   Paronychia Paronychia is an infection of the skin that surrounds a nail. It usually affects the skin around a fingernail, but it may also occur near a toenail. It often causes pain and swelling around the nail. This condition may come on suddenly or develop over a longer period. In some cases, a collection of pus (abscess) can form near or under the nail. Usually, paronychia is not serious and it clears up with treatment. CAUSES This condition may be caused by bacteria or fungi. It is commonly caused by either Streptococcus or Staphylococcus bacteria. The bacteria or fungi often cause the infection by getting into the affected area through an opening in the skin, such as a cut or a hangnail. RISK FACTORS This condition is more likely to develop in:  People who get their hands wet often, such as those who work as Designer, industrial/product, bartenders, or nurses.  People who bite their fingernails or suck their thumbs.  People who trim their nails too short.  People who have hangnails or injured fingertips.  People who get manicures.  People who have diabetes. SYMPTOMS Symptoms of this condition include:  Redness and swelling of the skin near the nail.  Tenderness around the nail when you touch the area.  Pus-filled bumps under the cuticle. The cuticle is the skin at the base or sides of the nail.  Fluid or pus under the nail.  Throbbing pain in the area. DIAGNOSIS This condition is usually diagnosed with a physical exam. In some cases, a sample of pus may be taken from an abscess to be tested in a lab. This can help to determine what type of bacteria or fungi is causing the condition. TREATMENT Treatment for this condition depends on the cause and severity of the  condition. If the condition is mild, it may clear up on its own in a few days. Your health care provider may recommend soaking the affected area in warm water a few times a day. When treatment is needed, the options may include:  Antibiotic medicine, if the condition is caused by a bacterial infection.  Antifungal medicine, if the condition is caused by a fungal infection.  Incision and drainage, if an abscess is present. In this procedure, the health care provider will cut open the abscess so the pus can drain out. HOME CARE INSTRUCTIONS  Soak the affected area in warm water if directed to do so by your health care provider. You may be told to do this for 20 minutes, 2-3 times a day. Keep the area dry in between soakings.  Take medicines only as directed by your health care provider.  If you were prescribed an antibiotic medicine, finish all of it even if you start to feel better.  Keep the affected area clean.  Do not try to drain a fluid-filled bump yourself.  If you will be washing dishes or performing other tasks that require your hands to get wet, wear rubber gloves. You should also wear gloves if your hands might come in contact with irritating substances, such as cleaners or chemicals.  Follow your health care provider's instructions about:  Wound care.  Bandage (dressing) changes and removal. SEEK MEDICAL CARE IF:  Your symptoms get worse or do  not improve with treatment.  You have a fever or chills.  You have redness spreading from the affected area.  You have continued or increased fluid, blood, or pus coming from the affected area.  Your finger or knuckle becomes swollen or is difficult to move.   This information is not intended to replace advice given to you by your health care provider. Make sure you discuss any questions you have with your health care provider.   Document Released: 01/09/2001 Document Revised: 11/30/2014 Document Reviewed: 06/23/2014 Elsevier  Interactive Patient Education [employer].

## 2016-11-09 ENCOUNTER — Encounter (HOSPITAL_COMMUNITY): Payer: Self-pay | Admitting: Emergency Medicine

## 2016-11-09 ENCOUNTER — Emergency Department (HOSPITAL_COMMUNITY)
Admission: EM | Admit: 2016-11-09 | Discharge: 2016-11-09 | Disposition: A | Discharge status: Home / Self Care | Payer: BLUE CROSS/BLUE SHIELD | Attending: Emergency Medicine | Admitting: Emergency Medicine

## 2016-11-09 DIAGNOSIS — I10 Essential (primary) hypertension: Secondary | ICD-10-CM | POA: Insufficient documentation

## 2016-11-09 DIAGNOSIS — L03011 Cellulitis of right finger: Secondary | ICD-10-CM | POA: Diagnosis not present

## 2016-11-09 DIAGNOSIS — Z7984 Long term (current) use of oral hypoglycemic drugs: Secondary | ICD-10-CM | POA: Diagnosis not present

## 2016-11-09 DIAGNOSIS — M79644 Pain in right finger(s): Secondary | ICD-10-CM | POA: Diagnosis present

## 2016-11-09 DIAGNOSIS — L02511 Cutaneous abscess of right hand: Secondary | ICD-10-CM | POA: Diagnosis not present

## 2016-11-09 DIAGNOSIS — Z79899 Other long term (current) drug therapy: Secondary | ICD-10-CM | POA: Insufficient documentation

## 2016-11-09 DIAGNOSIS — E119 Type 2 diabetes mellitus without complications: Secondary | ICD-10-CM | POA: Insufficient documentation

## 2016-11-09 LAB — CBG MONITORING, ED: GLUCOSE-CAPILLARY: 219 mg/dL — AB (ref 65–99)

## 2016-11-09 MED ORDER — HYDROCODONE-ACETAMINOPHEN 5-325 MG PO TABS
1.0000 | ORAL_TABLET | Freq: Once | ORAL | Status: AC
  Administered 2016-11-09: 1 via ORAL
  Filled 2016-11-09: qty 1

## 2016-11-09 MED ORDER — CEPHALEXIN 500 MG PO CAPS: 500.0000 mg | ORAL_CAPSULE | Freq: Four times a day (QID) | ORAL | 0 refills | Status: DC

## 2016-11-09 MED ORDER — LIDOCAINE HCL (PF) 1 % IJ SOLN
5.0000 mL | Freq: Once | INTRAMUSCULAR | Status: DC
  Filled 2016-11-09: qty 5

## 2016-11-09 MED ORDER — CEPHALEXIN 500 MG PO CAPS
500.0000 mg | ORAL_CAPSULE | Freq: Once | ORAL | Status: AC
  Administered 2016-11-09: 500 mg via ORAL
  Filled 2016-11-09: qty 1

## 2016-11-09 NOTE — ED Triage Notes (Signed)
Pt started having right thumb pain x 1 week ago and swelling. Swelling noted. nad.

## 2016-11-09 NOTE — ED Notes (Signed)
CBG in triage 219.

## 2016-11-09 NOTE — ED Provider Notes (Signed)
[facility] DEPT Provider Note   CSN: [id number] Arrival date & time: 11/09/16  1637     History   Chief Complaint Chief Complaint  Patient presents with  . Hand Pain    HPI Selena [name] is a 61 y.o. female.  HPI   Jacqueleen [name] is a 61 y.o. female who presents to the Emergency Department complaining of throbbing, redness and pain to right thumb. symptoms present for one week.  She states this is a recurrent problem for her and was seen here last year for same.  Denies nail biting, numbness or injury.  Also denies fever, chills or red streaking to the digit.    Past Medical History:  Diagnosis Date  . Diabetes mellitus without complication ([place])   . Fatigue   . Hypertension   . Snoring     Patient Active Problem List   Diagnosis Date Noted  . OBESITY, NOS 09/26/2006  . DEPRESSIVE DISORDER, NOS 09/26/2006    Past Surgical History:  Procedure Laterality Date  . CESAREAN SECTION    . FOOT SURGERY      OB History    No data available       Home Medications    Prior to Admission medications   Medication Sig Start Date End Date Taking? Authorizing Provider  cephALEXin (KEFLEX) 500 MG capsule Take 1 capsule (500 mg total) by mouth 4 (four) times daily. 01/15/16   Hope Bunnie Pion, NP  lisinopril (PRINIVIL,ZESTRIL) 2.5 MG tablet Take 2.5 mg by mouth daily.    Historical Provider, MD  metFORMIN (GLUCOPHAGE) 500 MG tablet Take by mouth 2 (two) times daily with a meal.    Historical Provider, MD  Jonetta Speak LANCETS 96G [place]  10/05/14   Historical Provider, MD  pravastatin (PRAVACHOL) 10 MG tablet Take 10 mg by mouth daily.    Historical Provider, MD  Vitamin D, Ergocalciferol, (DRISDOL) 50000 UNITS CAPS capsule Take 50,000 Units by mouth every 7 (seven) days.    Historical Provider, MD    Family History Family History  Problem Relation Age of Onset  . Kidney disease Mother   . Hypertension Mother   . Diabetes Mother     Social History Social  History  Substance Use Topics  . Smoking status: Never Smoker  . Smokeless tobacco: Never Used  . Alcohol use No     Allergies   Patient has no known allergies.   Review of Systems Review of Systems  Constitutional: Negative for chills and fever.  Gastrointestinal: Negative for nausea and vomiting.  Musculoskeletal: Negative for arthralgias and joint swelling.       Pain, redness and swelling of the distal right thumb  Neurological: Negative for weakness and numbness.  Hematological: Negative for adenopathy.  All other systems reviewed and are negative.    Physical Exam Updated Vital Signs BP (!) 144/95 (BP Location: Left Arm)   Pulse 76   Temp 97.6 F (36.4 C) (Oral)   Resp 18   Ht 5' (1.524 m)   Wt 86.2 kg   SpO2 98%   BMI 37.11 kg/m   Physical Exam  Constitutional: She is oriented to person, place, and time. She appears well-developed and well-nourished. No distress.  HENT:  Head: Normocephalic and atraumatic.  Cardiovascular: Normal rate, regular rhythm and normal heart sounds.   No murmur heard. Pulmonary/Chest: Effort normal and breath sounds normal. No respiratory distress.  Musculoskeletal: Normal range of motion. She exhibits tenderness.  Neurological: She is alert and oriented to person,  place, and time. She exhibits normal muscle tone. Coordination normal.  Skin: Skin is warm and dry. There is erythema.  Abscess to the cuticle of the medial right thumb.  Focal tenderness and erythema.  No lymphangitis.   Nursing note and vitals reviewed.    ED Treatments / Results  Labs (all labs ordered are listed, but only abnormal results are displayed) Labs Reviewed  CBG MONITORING, ED - Abnormal; Notable for the following:       Result Value   Glucose-Capillary 219 (*)    All other components within normal limits    EKG  EKG Interpretation None       Radiology No results found.  Procedures Procedures (including critical care time)  INCISION  AND DRAINAGE Performed by: Hale Bogus. Consent: Verbal consent obtained. Risks and benefits: risks, benefits and alternatives were discussed Type: abscess  Body area: right thumb  Anesthesia: none  Incision was made with a #11 scalpel.  Local anesthetic: none  Complexity: simple  Drainage: purulent  Drainage amount: large  Packing material: none Patient tolerance: Patient tolerated the procedure well with no immediate complications.     Medications Ordered in ED Medications  lidocaine (PF) (XYLOCAINE) 1 % injection 5 mL (not administered)  HYDROcodone-acetaminophen (NORCO/VICODIN) 5-325 MG per tablet 1 tablet (not administered)  cephALEXin (KEFLEX) capsule 500 mg (not administered)     Initial Impression / Assessment and Plan / ED Course  I have reviewed the triage vital signs and the nursing notes.  Pertinent labs & imaging results that were available during my care of the patient were reviewed by me and considered in my medical decision making (see chart for details).     Recurrent paronychia of right thumb.  NV intact.  Successful I&D.  Pain improved.    Final Clinical Impressions(s) / ED Diagnoses   Final diagnoses:  Paronychia of right thumb    New Prescriptions New Prescriptions   No medications on file     Kem Parkinson, PA-C 11/09/16 [place], MD 11/09/16 [phone]

## 2016-11-09 NOTE — ED Notes (Signed)
Patient verbalizes understanding of discharge instructions, prescriptions, home care and follow up care. Patient out of department at this time. 

## 2016-11-09 NOTE — Discharge Instructions (Signed)
Soak your thumb in warm water 2-3 times a day.  Elevate your hand.  Antibiotics as directed.  Return here for any worsening symptoms

## 2016-11-09 NOTE — ED Triage Notes (Signed)
L thumb cuticle swelling and painful for 1 week  PCP [facility] medical

## 2016-11-28 ENCOUNTER — Encounter (INDEPENDENT_AMBULATORY_CARE_PROVIDER_SITE_OTHER): Payer: Self-pay | Admitting: *Deleted

## 2017-03-27 ENCOUNTER — Encounter: Payer: Self-pay | Admitting: Family Medicine

## 2017-03-27 ENCOUNTER — Ambulatory Visit (INDEPENDENT_AMBULATORY_CARE_PROVIDER_SITE_OTHER): Payer: BLUE CROSS/BLUE SHIELD | Admitting: Family Medicine

## 2017-03-27 DIAGNOSIS — E119 Type 2 diabetes mellitus without complications: Secondary | ICD-10-CM | POA: Insufficient documentation

## 2017-03-27 DIAGNOSIS — I10 Essential (primary) hypertension: Secondary | ICD-10-CM | POA: Insufficient documentation

## 2017-03-27 DIAGNOSIS — E785 Hyperlipidemia, unspecified: Secondary | ICD-10-CM

## 2017-03-27 LAB — CBC
HCT: 41.1 % (ref 35.0–45.0)
Hemoglobin: 13.7 g/dL (ref 11.7–15.5)
MCH: 28.1 pg (ref 27.0–33.0)
MCHC: 33.3 g/dL (ref 32.0–36.0)
MCV: 84.2 fL (ref 80.0–100.0)
MPV: 9.7 fL (ref 7.5–12.5)
PLATELETS: 291 10*3/uL (ref 140–400)
RBC: 4.88 MIL/uL (ref 3.80–5.10)
RDW: 14.6 % (ref 11.0–15.0)
WBC: 5.9 10*3/uL (ref 3.8–10.8)

## 2017-03-27 NOTE — Progress Notes (Signed)
   Subjective:    Patient ID: [name], female    DOB: [birth year], 61 y.o.   MRN: [id number]  HPI    Review of Systems     Objective:   Physical Exam        Assessment & Plan:

## 2017-03-27 NOTE — Progress Notes (Signed)
Subjective:    Patient ID: [name], female    DOB: November 08, 1955, 61 y.o.   MRN: [id number]  Noncompliant diabetic with a 'very high" A1c ( does not know number) who drinks soda and sweet tea and does not check her sugar.  She already has renal impairment and numbness in feet.  Does not go to podiatry to eye doctor.   Diabetes  She presents for her initial diabetic visit. She has type 2 diabetes mellitus. No MedicAlert identification noted. Her disease course has been fluctuating. There are no hypoglycemic associated symptoms. Pertinent negatives for hypoglycemia include no dizziness, headaches or nervousness/anxiousness. Associated symptoms include blurred vision, fatigue, foot paresthesias, polyuria and weight loss. Pertinent negatives for diabetes include no chest pain. There are no hypoglycemic complications. Symptoms are worsening. There are no diabetic complications. Risk factors for coronary artery disease include diabetes mellitus, family history, hypertension and dyslipidemia. Current diabetic treatment includes diet and oral agent (triple therapy). She is compliant with treatment none of the time. Her weight is decreasing steadily. She is following a generally unhealthy diet. When asked about meal planning, she reported none. She has not had a previous visit with a dietitian. She rarely participates in exercise. An ACE inhibitor/angiotensin II receptor blocker is being taken. She does not see a podiatrist.Eye exam is not current.   Past Medical History:  Diagnosis Date  . Depression   . Diabetes mellitus without complication ([place])   . Fatigue   . Hyperlipidemia   . Hypertension   . Snoring    Current Outpatient Prescriptions on File Prior to Visit  Medication Sig Dispense Refill  . ONETOUCH DELICA LANCETS 61P MISC   0   No current facility-administered medications on file prior to visit.    Outpatient Encounter Prescriptions as of 03/27/2017  Medication Sig  .  atorvastatin (LIPITOR) 20 MG tablet   . FARXIGA 10 MG TABS tablet   . ONETOUCH DELICA LANCETS 50D MISC   . TRADJENTA 5 MG TABS tablet   . lisinopril (PRINIVIL,ZESTRIL) 5 MG tablet  Metformin 500 BID    No facility-administered encounter medications on file as of 03/27/2017.       Review of Systems  Constitutional: Positive for fatigue and weight loss. Negative for activity change, appetite change and unexpected weight change.  HENT: Negative for congestion, dental problem, postnasal drip and rhinorrhea.   Eyes: Positive for blurred vision. Negative for redness and visual disturbance.  Respiratory: Negative for cough and shortness of breath.   Cardiovascular: Negative for chest pain, palpitations and leg swelling.  Gastrointestinal: Negative for abdominal pain, constipation and diarrhea.  Endocrine: Positive for polyuria.  Genitourinary: Negative for difficulty urinating, frequency and menstrual problem.  Musculoskeletal: Negative for arthralgias and back pain.  Neurological: Negative for dizziness and headaches.  Psychiatric/Behavioral: Negative for dysphoric mood and sleep disturbance. The patient is not nervous/anxious.        Objective:   Physical Exam  Constitutional: She is oriented to person, place, and time. She appears well-developed and well-nourished. No distress.  obese  HENT:  Head: Normocephalic and atraumatic.  Mouth/Throat: Oropharynx is clear and moist.  Eyes: Pupils are equal, round, and reactive to light. Conjunctivae are normal.  Neck: Normal range of motion. No thyromegaly present.  Cardiovascular: Normal rate, regular rhythm and normal heart sounds.   Pulmonary/Chest: Effort normal and breath sounds normal. No respiratory distress.  Abdominal: Soft. Bowel sounds are normal.  Musculoskeletal: Normal range of motion. She exhibits no edema.  Lymphadenopathy:  She has no cervical adenopathy.  Neurological: She is alert and oriented to person, place, and  time.  Skin: Skin is warm and dry.  Psychiatric: She has a normal mood and affect. Her behavior is normal. Thought content normal.    Type 2 diabetes mellitus without complication, without long-term current use of insulin (HCC) - Plan: CBC, COMPLETE METABOLIC PANEL WITH GFR, Hemoglobin A1c, Lipid panel, VITAMIN D 25 Hydroxy (Vit-D Deficiency, Fractures), Microalbumin / creatinine urine ratio, Urinalysis, Routine w reflex microscopic  Essential hypertension  Hyperlipidemia LDL goal <100        Assessment & Plan:   Patient Instructions  STOP sweet drinks Limit carbohydrates (breads and pasta) Walk every day that you are able No changes in medicine today  Need old records  Need to come back for a PAP and PE

## 2017-03-27 NOTE — Patient Instructions (Signed)
STOP sweet drinks Limit carbohydrates (breads and pasta) Walk every day that you are able No changes in medicine today  Need old records  Need to come back for a PAP and PE

## 2017-03-28 ENCOUNTER — Other Ambulatory Visit: Payer: Self-pay

## 2017-03-28 ENCOUNTER — Encounter: Payer: Self-pay | Admitting: Family Medicine

## 2017-03-28 LAB — COMPLETE METABOLIC PANEL WITH GFR
ALBUMIN: 4.1 g/dL (ref 3.6–5.1)
ALK PHOS: 66 U/L (ref 33–130)
ALT: 12 U/L (ref 6–29)
AST: 12 U/L (ref 10–35)
BILIRUBIN TOTAL: 0.3 mg/dL (ref 0.2–1.2)
BUN: 13 mg/dL (ref 7–25)
CO2: 23 mmol/L (ref 20–32)
Calcium: 9.9 mg/dL (ref 8.6–10.4)
Chloride: 109 mmol/L (ref 98–110)
Creat: 0.93 mg/dL (ref 0.50–0.99)
GFR, EST AFRICAN AMERICAN: 76 mL/min (ref >=60)
GFR, EST NON AFRICAN AMERICAN: 66 mL/min (ref >=60)
Glucose, Bld: 140 mg/dL — ABNORMAL HIGH (ref 65–99)
POTASSIUM: 4.1 mmol/L (ref 3.5–5.3)
Sodium: 144 mmol/L (ref 135–146)
TOTAL PROTEIN: 7.5 g/dL (ref 6.1–8.1)

## 2017-03-28 LAB — VITAMIN D 25 HYDROXY (VIT D DEFICIENCY, FRACTURES): Vit D, 25-Hydroxy: 22 ng/mL — ABNORMAL LOW (ref 30–100)

## 2017-03-28 LAB — URINALYSIS, MICROSCOPIC ONLY
BACTERIA UA: NONE SEEN [HPF]
CASTS: NONE SEEN [LPF]
CRYSTALS: NONE SEEN [HPF]
RBC / HPF: NONE SEEN RBC/HPF (ref <=2)
SQUAMOUS EPITHELIAL / LPF: NONE SEEN [HPF] (ref <=5)

## 2017-03-28 LAB — HEMOGLOBIN A1C
HEMOGLOBIN A1C: 8 % — AB (ref <5.7)
MEAN PLASMA GLUCOSE: 183 mg/dL

## 2017-03-28 LAB — LIPID PANEL
CHOLESTEROL: 209 mg/dL — AB (ref <200)
HDL: 60 mg/dL (ref >50)
LDL Cholesterol: 127 mg/dL — ABNORMAL HIGH (ref <100)
Total CHOL/HDL Ratio: 3.5 Ratio (ref <5.0)
Triglycerides: 111 mg/dL (ref <150)
VLDL: 22 mg/dL (ref <30)

## 2017-03-28 LAB — MICROALBUMIN / CREATININE URINE RATIO
CREATININE, URINE: 48 mg/dL (ref 20–320)
MICROALB UR: 0.6 mg/dL
MICROALB/CREAT RATIO: 13 ug/mg{creat} (ref <30)

## 2017-03-28 LAB — URINALYSIS, ROUTINE W REFLEX MICROSCOPIC
Bilirubin Urine: NEGATIVE
HGB URINE DIPSTICK: NEGATIVE
KETONES UR: NEGATIVE
Leukocytes, UA: NEGATIVE
NITRITE: NEGATIVE
PH: 6 (ref 5.0–8.0)
PROTEIN: NEGATIVE
Specific Gravity, Urine: 1.022 (ref 1.001–1.035)

## 2017-03-28 NOTE — Addendum Note (Signed)
Addended by: Ova Freshwater on: 03/28/2017 04:36 PM   Modules accepted: Orders

## 2017-04-26 ENCOUNTER — Telehealth: Payer: Self-pay | Admitting: Family Medicine

## 2017-04-26 MED ORDER — TRADJENTA 5 MG PO TABS: 5.0000 mg | ORAL_TABLET | Freq: Every day | ORAL | 1 refills | Status: DC

## 2017-04-26 MED ORDER — FARXIGA 10 MG PO TABS: 10.0000 mg | ORAL_TABLET | Freq: Every day | ORAL | Status: DC

## 2017-04-26 NOTE — Telephone Encounter (Signed)
[name], left message wanting to clarify that patient should be on [place] both [phone]

## 2017-04-26 NOTE — Telephone Encounter (Signed)
Patient walked in with Rx bottle.  Requesting Dr. Meda Coffee to refill  Tradjenta 5mg  tab BOE (she is completely out)   Patient has CPE this Wednesday w/Dr. Addison Naegeli

## 2017-04-29 NOTE — Telephone Encounter (Signed)
She only came in for first visit.  I will address this at her follow up this week

## 2017-05-01 ENCOUNTER — Ambulatory Visit (INDEPENDENT_AMBULATORY_CARE_PROVIDER_SITE_OTHER): Payer: BLUE CROSS/BLUE SHIELD | Admitting: Family Medicine

## 2017-05-01 ENCOUNTER — Other Ambulatory Visit: Payer: Self-pay | Admitting: Family Medicine

## 2017-05-01 ENCOUNTER — Encounter: Payer: Self-pay | Admitting: Family Medicine

## 2017-05-01 ENCOUNTER — Encounter (INDEPENDENT_AMBULATORY_CARE_PROVIDER_SITE_OTHER): Payer: Self-pay | Admitting: *Deleted

## 2017-05-01 VITALS — BP 158/98 | HR 68 | Temp 98.4°F | Resp 18 | Ht 59.0 in | Wt 195.1 lb

## 2017-05-01 DIAGNOSIS — E785 Hyperlipidemia, unspecified: Secondary | ICD-10-CM | POA: Diagnosis not present

## 2017-05-01 DIAGNOSIS — Z1211 Encounter for screening for malignant neoplasm of colon: Secondary | ICD-10-CM

## 2017-05-01 DIAGNOSIS — E119 Type 2 diabetes mellitus without complications: Secondary | ICD-10-CM | POA: Diagnosis not present

## 2017-05-01 DIAGNOSIS — Z1239 Encounter for other screening for malignant neoplasm of breast: Secondary | ICD-10-CM

## 2017-05-01 DIAGNOSIS — N950 Postmenopausal bleeding: Secondary | ICD-10-CM | POA: Diagnosis not present

## 2017-05-01 DIAGNOSIS — B351 Tinea unguium: Secondary | ICD-10-CM

## 2017-05-01 DIAGNOSIS — Z23 Encounter for immunization: Secondary | ICD-10-CM

## 2017-05-01 DIAGNOSIS — I10 Essential (primary) hypertension: Secondary | ICD-10-CM | POA: Diagnosis not present

## 2017-05-01 DIAGNOSIS — H547 Unspecified visual loss: Secondary | ICD-10-CM

## 2017-05-01 DIAGNOSIS — Z1231 Encounter for screening mammogram for malignant neoplasm of breast: Secondary | ICD-10-CM

## 2017-05-01 MED ORDER — METFORMIN HCL ER 750 MG PO TB24: 750.0000 mg | ORAL_TABLET | Freq: Every day | ORAL | 1 refills | Status: DC

## 2017-05-01 NOTE — Progress Notes (Signed)
Chief Complaint  Patient presents with  . Annual Exam   Her for annual PE I brought her in to do a PAP and PE, but on history she is having post menopausal bleeding, scant, blood on tissue when she wipes.  Needs an endometrial Bx so is referred to GYN.  Reason for referral explained. Over due for mammogram Never had colon cancer screen Needs immunizations Overdue for Eye exam,  Has some vision changes, decreased acuity She does not see a podiatrist and has long dystrophic nails and onychomycosis She has uncontrolled diabetes.  Is on Farxiga and Trajenta.  Says she did not tolerate metformin due to diarrhea.  Also tells me now she has constipation.  Is willing to try the long acting metformin with the reassurance the side effects are usually less. Does not check her sugars regularly BP not controlled , but didn't take med today lipids not well controlled last labs but I doubled her lipitor She had vit D deficiency and now takes supplement   Patient Active Problem List   Diagnosis Date Noted  . Type 2 diabetes mellitus without complication, without long-term current use of insulin ([place]) 03/27/2017  . Essential hypertension 03/27/2017  . Hyperlipidemia LDL goal <100 03/27/2017  . OBESITY, NOS 09/26/2006    Outpatient Encounter Prescriptions as of 05/01/2017  Medication Sig  . atorvastatin (LIPITOR) 40 MG tablet Take 40 mg by mouth daily.  Marland Kitchen FARXIGA 10 MG TABS tablet Take 10 mg by mouth daily.  Marland Kitchen lisinopril (PRINIVIL,ZESTRIL) 5 MG tablet   . ONETOUCH DELICA LANCETS 23F MISC   . metFORMIN (GLUCOPHAGE XR) 750 MG 24 hr tablet Take 1 tablet (750 mg total) by mouth daily with breakfast.   No facility-administered encounter medications on file as of 05/01/2017.     No Known Allergies  Review of Systems  Constitutional: Negative for activity change, appetite change and unexpected weight change.  HENT: Negative for congestion, dental problem, postnasal drip and rhinorrhea.   Eyes:  Positive for visual disturbance. Negative for redness.  Respiratory: Negative for cough and shortness of breath.   Cardiovascular: Negative for chest pain, palpitations and leg swelling.  Gastrointestinal: Positive for constipation. Negative for abdominal pain, blood in stool and diarrhea.  Genitourinary: Positive for frequency and vaginal bleeding. Negative for difficulty urinating.  Musculoskeletal: Negative for arthralgias and back pain.  Neurological: Negative for dizziness and headaches.  Hematological: Negative for adenopathy. Does not bruise/bleed easily.  Psychiatric/Behavioral: Positive for sleep disturbance. Negative for dysphoric mood. The patient is not nervous/anxious.        Up at night to urinate   Physical Exam  Cardiovascular:  Pulses:      Dorsalis pedis pulses are 2+ on the right side, and 2+ on the left side.       Posterior tibial pulses are 2+ on the right side, and 2+ on the left side.  Musculoskeletal:       Right foot: There is normal range of motion and no deformity.       Left foot: There is normal range of motion and no deformity.  Feet:  Right Foot:  Protective Sensation: 6 sites tested. 6 sites sensed.  Skin Integrity: Negative for ulcer, skin breakdown or callus.  Left Foot:  Protective Sensation: 6 sites tested. 6 sites sensed.  Skin Integrity: Negative for blister, skin breakdown or callus.    BP (!) 158/98 (BP Location: Right Arm, Patient Position: Sitting, Cuff Size: Normal)   Pulse 68  Temp 98.4 F (36.9 C) (Oral)   Resp 18   Ht 4\' 11"  (1.499 m)   Wt 195 lb 1.3 oz (88.5 kg)   SpO2 100%   BMI 39.40 kg/m   General Appearance:    Alert, cooperative, no distress, appears stated age. obese  Head:    Normocephalic, without obvious abnormality, atraumatic  Eyes:    PERRL, conjunctiva/corneas clear, EOM's intact, fundi    benign, both eyes  Ears:    Normal TM's and external ear canals, both ears  Nose:   Nares normal, septum midline, mucosa  normal, no drainage    or sinus tenderness  Throat:   Lips, mucosa, and tongue normal; teeth and gums normal  Neck:   Supple, symmetrical, trachea midline, no adenopathy;    thyroid:  no enlargement/tenderness/nodules; no carotid   bruit   Back:     Symmetric, no curvature, ROM normal, no CVA tenderness  Lungs:     Clear to auscultation bilaterally, respirations unlabored  Chest Wall:    No tenderness or deformity   Heart:    Regular rate and rhythm, S1 and S2 normal, no murmur, rub   or gallop  Breast Exam:    No tenderness, masses, or nipple abnormality  Abdomen:     Soft, non-tender, bowel sounds active all four quadrants,    no masses, no organomegaly  Extremities:   Extremities normal, atraumatic, no cyanosis or edema  Pulses:   2+ and symmetric all extremities  Skin:   Skin color, texture, turgor normal, no rashes or lesions.  Toenails with long thickened dystrophic nails  Lymph nodes:   Cervical, supraclavicular, and axillary nodes normal  Neurologic:   Normal strength, sensation and reflexes    throughout     ASSESSMENT/PLAN:  1. Essential hypertension Uncontrolled - didn't take med  2. Type 2 diabetes mellitus without complication, without long-term current use of insulin (HCC) Not controlled, does not check sugars - CBC - COMPLETE METABOLIC PANEL WITH GFR - Hemoglobin A1c - Lipid panel - Microalbumin / creatinine urine ratio - Urinalysis, Routine w reflex microscopic - Ambulatory referral to Ophthalmology - Ambulatory referral to Podiatry  3. Post-menopausal bleeding New finding - Ambulatory referral to Gynecology  4. Vision impairment Complaint.  No recent eye exam - Ambulatory referral to Ophthalmology  5. Onychomycosis of toenail - Ambulatory referral to Podiatry  6. Hyperlipidemia LDL goal <100  7. Screening for breast cancer mammo  8. Screen for colon cancer - Ambulatory referral to Gastroenterology  Patient seen for annual wellness PE plus  diabetes management and referrals  Patient Instructions  Stop tradjenta Start the metformin 750 long acting  Referred for over due health exams: Eye exam Foot exam GYN exam Mammogram Colonoscopy  Need blood work and follow up in November   Raylene Everts, MD

## 2017-05-01 NOTE — Patient Instructions (Addendum)
Stop tradjenta Start the metformin 750 long acting  Referred for over due health exams: Eye exam Foot exam GYN exam Mammogram Colonoscopy  Need blood work and follow up in November

## 2017-05-07 ENCOUNTER — Encounter: Payer: Self-pay | Admitting: *Deleted

## 2017-05-07 ENCOUNTER — Encounter: Payer: Self-pay | Admitting: Family Medicine

## 2017-05-08 ENCOUNTER — Ambulatory Visit (HOSPITAL_COMMUNITY): Payer: BLUE CROSS/BLUE SHIELD

## 2017-05-08 ENCOUNTER — Encounter (HOSPITAL_COMMUNITY): Payer: Self-pay

## 2017-05-16 ENCOUNTER — Encounter (HOSPITAL_COMMUNITY): Payer: Self-pay

## 2017-05-16 ENCOUNTER — Ambulatory Visit (HOSPITAL_COMMUNITY)
Admission: RE | Admit: 2017-05-16 | Discharge: 2017-05-16 | Disposition: A | Discharge status: Home / Self Care | Payer: BLUE CROSS/BLUE SHIELD | Source: Ambulatory Visit | Attending: Family Medicine | Admitting: Family Medicine

## 2017-05-16 DIAGNOSIS — Z1231 Encounter for screening mammogram for malignant neoplasm of breast: Secondary | ICD-10-CM

## 2017-06-05 ENCOUNTER — Other Ambulatory Visit: Payer: Self-pay

## 2017-06-05 ENCOUNTER — Ambulatory Visit: Payer: BLUE CROSS/BLUE SHIELD | Admitting: Family Medicine

## 2017-06-05 ENCOUNTER — Encounter: Payer: Self-pay | Admitting: Family Medicine

## 2017-06-05 VITALS — BP 128/84 | HR 68 | Temp 98.5°F | Resp 16 | Ht 59.0 in | Wt 200.1 lb

## 2017-06-05 DIAGNOSIS — I10 Essential (primary) hypertension: Secondary | ICD-10-CM | POA: Diagnosis not present

## 2017-06-05 DIAGNOSIS — Z23 Encounter for immunization: Secondary | ICD-10-CM | POA: Diagnosis not present

## 2017-06-05 DIAGNOSIS — E119 Type 2 diabetes mellitus without complications: Secondary | ICD-10-CM

## 2017-06-05 MED ORDER — LISINOPRIL 5 MG PO TABS: 5.0000 mg | ORAL_TABLET | Freq: Every day | ORAL | 3 refills | Status: DC

## 2017-06-05 NOTE — Patient Instructions (Addendum)
Need diabetic eye exam I recommend a diabetic education visit for you and your husband Watch the sweets and carbohydrates in your diet See the GYN doctor tomorrow  See me in 3 months

## 2017-06-05 NOTE — Progress Notes (Signed)
Chief Complaint  Patient presents with  . Diabetes   Patient is here for follow-up sooner than expected.  She is due for repeat diabetes laboratory work at the end of this month and I wanted to see her afterwards.  Nothing new to report.  Her weight is stable.  Her blood pressure is good.  She is compliant with her medications. Her husband is with her today.  He reports that she drinks soda, eats sweets, is noncompliant with her diet.  General principles of diabetic eating are reviewed with she and her husband.  I recommend a referral to a diabetes educator that both she and her husband attend. She went to a podiatrist. She has not yet scheduled her eye exam. She did schedule an appointment with a GYN.  This is tomorrow.  She is still having postmenopausal bleeding.  Patient Active Problem List   Diagnosis Date Noted  . Type 2 diabetes mellitus without complication, without long-term current use of insulin ([place]) 03/27/2017  . Essential hypertension 03/27/2017  . Hyperlipidemia LDL goal <100 03/27/2017  . OBESITY, NOS 09/26/2006    Outpatient Encounter Medications as of 06/05/2017  Medication Sig  . atorvastatin (LIPITOR) 40 MG tablet Take 40 mg by mouth daily.  Marland Kitchen lisinopril (PRINIVIL,ZESTRIL) 5 MG tablet Take 1 tablet (5 mg total) daily by mouth.  . metFORMIN (GLUCOPHAGE XR) 750 MG 24 hr tablet Take 1 tablet (750 mg total) by mouth daily with breakfast.  . ONETOUCH DELICA LANCETS 26R MISC   . [DISCONTINUED] lisinopril (PRINIVIL,ZESTRIL) 5 MG tablet   . FARXIGA 10 MG TABS tablet Take 10 mg by mouth daily. (Patient not taking: Reported on 06/05/2017)   No facility-administered encounter medications on file as of 06/05/2017.     No Known Allergies  Review of Systems  Constitutional: Negative for activity change, appetite change and unexpected weight change.  HENT: Negative for congestion, dental problem, postnasal drip and rhinorrhea.   Eyes: Positive for visual disturbance.  Negative for redness.  Respiratory: Negative for cough and shortness of breath.   Cardiovascular: Negative for chest pain, palpitations and leg swelling.  Gastrointestinal: Positive for constipation. Negative for abdominal pain, blood in stool and diarrhea.  Genitourinary: Positive for frequency and vaginal bleeding. Negative for difficulty urinating.  Musculoskeletal: Negative for arthralgias and back pain.  Neurological: Negative for dizziness and headaches.  Hematological: Negative for adenopathy. Does not bruise/bleed easily.  Psychiatric/Behavioral: Positive for sleep disturbance. Negative for dysphoric mood. The patient is not nervous/anxious.        Up at night to urinate    BP 128/84 (BP Location: Left Arm, Patient Position: Sitting, Cuff Size: Normal)   Pulse 68   Temp 98.5 F (36.9 C) (Temporal)   Resp 16   Ht 4\' 11"  (1.499 m)   Wt 200 lb 1.9 oz (90.8 kg)   SpO2 100%   BMI 40.42 kg/m   Physical Exam  Constitutional: She is oriented to person, place, and time. She appears well-developed and well-nourished. No distress.  obese  HENT:  Head: Normocephalic and atraumatic.  Mouth/Throat: Oropharynx is clear and moist.  Eyes: Conjunctivae are normal. Pupils are equal, round, and reactive to light.  Neck: Normal range of motion. No thyromegaly present.  Cardiovascular: Normal rate, regular rhythm and normal heart sounds.  Pulmonary/Chest: Effort normal and breath sounds normal. No respiratory distress.  Abdominal: Soft. Bowel sounds are normal.  Musculoskeletal: Normal range of motion. She exhibits no edema.  Lymphadenopathy:    She  has no cervical adenopathy.  Neurological: She is alert and oriented to person, place, and time.  Skin: Skin is warm and dry.  Psychiatric: She has a normal mood and affect. Her behavior is normal. Thought content normal.    ASSESSMENT/PLAN:  1. Type 2 diabetes mellitus without complication, without long-term current use of insulin  (HCC) Not well controlled.  Noncompliant with diet.  Discussed - Ambulatory referral to diabetic education  2. Essential hypertension Controlled  3. Need for influenza vaccination Done.  She also needs a pneumonia shot but wants to get them at separate visits - Flu Vaccine QUAD 36+ mos IM   Patient Instructions  Need diabetic eye exam I recommend a diabetic education visit for you and your husband Watch the sweets and carbohydrates in your diet See the GYN doctor tomorrow  See me in 3 months     Raylene Everts, MD

## 2017-06-06 ENCOUNTER — Ambulatory Visit (INDEPENDENT_AMBULATORY_CARE_PROVIDER_SITE_OTHER): Payer: BLUE CROSS/BLUE SHIELD | Admitting: Adult Health

## 2017-06-06 ENCOUNTER — Encounter: Payer: Self-pay | Admitting: Adult Health

## 2017-06-06 VITALS — BP 136/70 | HR 84 | Resp 16 | Ht 59.0 in | Wt 200.5 lb

## 2017-06-06 DIAGNOSIS — N950 Postmenopausal bleeding: Secondary | ICD-10-CM | POA: Diagnosis not present

## 2017-06-06 DIAGNOSIS — R319 Hematuria, unspecified: Secondary | ICD-10-CM

## 2017-06-06 LAB — POCT URINALYSIS DIPSTICK
Leukocytes, UA: NEGATIVE
NITRITE UA: NEGATIVE
Protein, UA: NEGATIVE
RBC UA: NEGATIVE

## 2017-06-06 NOTE — Patient Instructions (Signed)

## 2017-06-06 NOTE — Progress Notes (Signed)
Subjective:     Patient ID: [name], female   DOB: 07/12/1956, 61 y.o.   MRN: [id number]  HPI [name] is a 61 year old black female in for ?blood in urine vs vaginal bleeding,she had been on different meds.  PCP is Dr Meda Coffee.  Review of Systems ?blood in urine ?vaginal bleeding sees blood on paper when wipes Reviewed past medical,surgical, social and family history. Reviewed medications and allergies.     Objective:   Physical Exam BP 136/70 (BP Location: Left Arm, Patient Position: Sitting, Cuff Size: Normal)   Pulse 84   Resp 16   Ht 4\' 11"  (1.499 m)   Wt 200 lb 8 oz (90.9 kg)   BMI 40.50 kg/m urine dipstick 2+glucose and +ketones, no blood,Skin warm and dry.Pelvic: external genitalia is normal in appearance no lesions, vagina: pale with loss of rugae and  without odor, no blood seen,urethra has no lesions or masses noted, cervix:smooth, uterus: normal size, shape and contour,mildly tender, no masses felt, adnexa: no masses or tenderness noted. Bladder is non tender and no masses felt.    PHQ 2 score 0. Discussed getting [place] to assess uterus.   Assessment:     1. PMB (postmenopausal bleeding)   2. Hematuria, unspecified type       Plan:     Return in 1 week for GYN [place] Review handout on PMB

## 2017-06-11 ENCOUNTER — Ambulatory Visit (INDEPENDENT_AMBULATORY_CARE_PROVIDER_SITE_OTHER): Payer: BLUE CROSS/BLUE SHIELD

## 2017-06-11 DIAGNOSIS — N950 Postmenopausal bleeding: Secondary | ICD-10-CM

## 2017-06-11 NOTE — Progress Notes (Signed)
PELVIC US TA/TV: Heterogeneous anteverted uterus w/mult.fibroids,(#1) posterior subserosal fibroid 3.4 x 2.7 x 2.7 cm,(#2) anterior intramural fibroid 3.4 x 2.5 3.1 cm,(#3) anterior submucosal fibroid .8 x .6 x .9 cm,irregular thickened endometrium 10 mm,endometrium is distorted by the submucosal fibroid,bilaterally enlarged ovaries,left ovary 9.8 ml,right ovary 9 .1 ml,exophytic simple right ovarian cyst 2.2 x 1.7 cm,no free fluid,ovaries appear mobile,some discomfort during ultrasound

## 2017-06-12 ENCOUNTER — Encounter: Payer: Self-pay | Admitting: Adult Health

## 2017-06-12 ENCOUNTER — Telehealth: Payer: Self-pay | Admitting: Adult Health

## 2017-06-12 DIAGNOSIS — D251 Intramural leiomyoma of uterus: Secondary | ICD-10-CM | POA: Insufficient documentation

## 2017-06-12 DIAGNOSIS — R9389 Abnormal findings on diagnostic imaging of other specified body structures: Secondary | ICD-10-CM

## 2017-06-12 HISTORY — DX: Abnormal findings on diagnostic imaging of other specified body structures: R93.89

## 2017-06-12 HISTORY — DX: Intramural leiomyoma of uterus: D25.1

## 2017-06-12 NOTE — Telephone Encounter (Signed)
Left message to call me about US °

## 2017-06-12 NOTE — Telephone Encounter (Signed)
Pt aware that she has fibroids and simple cyst right ovary and that endometrium is thickened at 10.1 mm, and needs endometrial biopsy, appt scheduled with Dr Elonda Husky 11/30.

## 2017-06-12 NOTE — Telephone Encounter (Signed)
Pt wanted husband to know what she is having done, so explained endometrial biopsy to him ,he will come with her 11/30

## 2017-06-28 ENCOUNTER — Encounter: Payer: Self-pay | Admitting: Obstetrics & Gynecology

## 2017-06-28 ENCOUNTER — Other Ambulatory Visit: Payer: Self-pay

## 2017-06-28 ENCOUNTER — Ambulatory Visit (INDEPENDENT_AMBULATORY_CARE_PROVIDER_SITE_OTHER): Payer: BLUE CROSS/BLUE SHIELD | Admitting: Obstetrics & Gynecology

## 2017-06-28 ENCOUNTER — Other Ambulatory Visit: Payer: Self-pay | Admitting: Obstetrics & Gynecology

## 2017-06-28 VITALS — BP 152/90 | HR 86 | Ht 60.0 in | Wt 202.0 lb

## 2017-06-28 DIAGNOSIS — N950 Postmenopausal bleeding: Secondary | ICD-10-CM | POA: Diagnosis not present

## 2017-06-28 DIAGNOSIS — N840 Polyp of corpus uteri: Secondary | ICD-10-CM | POA: Diagnosis not present

## 2017-06-28 DIAGNOSIS — R9389 Abnormal findings on diagnostic imaging of other specified body structures: Secondary | ICD-10-CM | POA: Diagnosis not present

## 2017-06-28 NOTE — Progress Notes (Signed)
Endometrial Biopsy Procedure Note  Pre-operative Diagnosis: post menopausal bleeding + 10 mm endometrium  Post-operative Diagnosis: same  Indications: postmenopausal bleeding  Procedure Details   Urine pregnancy test was not done.  The risks (including infection, bleeding, pain, and uterine perforation) and benefits of the procedure were explained to the patient and Written informed consent was obtained.  Antibiotic prophylaxis against endocarditis was not indicated.   The patient was placed in the dorsal lithotomy position.  Bimanual exam showed the uterus to be in the neutral position.  A Graves' speculum inserted in the vagina, and the cervix prepped with povidone iodine.  Endocervical curettage with a Kevorkian curette was not performed.   A sharp tenaculum was applied to the anterior lip of the cervix for stabilization.  A sterile uterine sound was used to sound the uterus to a depth of 5.5cm.  A Pipelle endometrial aspirator was used to sample the endometrium.  Sample was sent for pathologic examination.  Condition: Stable  Complications: None  Plan:  The patient was advised to call for any fever or for prolonged or severe pain or bleeding. She was advised to use OTC analgesics as needed for mild to moderate pain. She was advised to avoid vaginal intercourse for 48 hours or until the bleeding has completely stopped.  Attending Physician Documentation: I was present for or performed the following: endometrial biopsy

## 2017-06-28 NOTE — Addendum Note (Signed)
Addended by: Gaylyn Rong A on: 06/28/2017 10:43 AM   Modules accepted: Orders

## 2017-07-04 ENCOUNTER — Ambulatory Visit (INDEPENDENT_AMBULATORY_CARE_PROVIDER_SITE_OTHER): Payer: BLUE CROSS/BLUE SHIELD | Admitting: Obstetrics & Gynecology

## 2017-07-04 ENCOUNTER — Encounter: Payer: Self-pay | Admitting: Obstetrics & Gynecology

## 2017-07-04 VITALS — BP 142/86 | HR 82 | Ht 60.0 in | Wt 203.0 lb

## 2017-07-04 DIAGNOSIS — N840 Polyp of corpus uteri: Secondary | ICD-10-CM | POA: Diagnosis not present

## 2017-07-04 NOTE — Progress Notes (Signed)
Follow up appointment for results  Chief Complaint  Patient presents with  . Follow-up    discuss biopsy    Blood pressure (!) 142/86, pulse 82, height 5' (1.524 m), weight 203 lb (92.1 kg).   Endometrial biopsy reveals a benign endometrial polyp and benign endometrium in general, no atypia and no proliferative changes   MEDS ordered this encounter: No orders of the defined types were placed in this encounter.   Orders for this encounter: No orders of the defined types were placed in this encounter.   Impression: Benign endometrial polyp as source of post menopausal bleeding and thickened endometrium  Plan: No indication to do polyp removal at this time unless becomes an issue for patient, spotting/bleeding etc  If it does become an issue, then she will contact me and we will plan a hysteroscopy with polyp removal and endometrial curettage  Otherwise continue her presribed primary care with Dr Meda Coffee  Follow Up: Return if symptoms worsen or fail to improve.       Face to face time:  10 minutes  Greater than 50% of the visit time was spent in counseling and coordination of care with the patient.  The summary and outline of the counseling and care coordination is summarized in the note above.   All questions were answered.  Past Medical History:  Diagnosis Date  . Depression   . Diabetes mellitus without complication ([place])   . Fatigue   . Fibroids, intramural 06/12/2017   Has subserosal and submucosal and intramural fibroids   . Hyperlipidemia   . Hypertension   . Snoring   . Thickened endometrium 06/12/2017   Will get endo bx    Past Surgical History:  Procedure Laterality Date  . CESAREAN SECTION     2  . FOOT SURGERY      OB History    Gravida Para Term Preterm AB Living   3 2 2  0 1 2   SAB TAB Ectopic Multiple Live Births   1 0 0 0 0      No Known Allergies  Social History   Socioeconomic History  . Marital status: Married    Spouse  name: Dominica Severin  . Number of children: 2  . Years of education: 12th grd  . Highest education level: None  Social Needs  . Financial resource strain: None  . Food insecurity - worry: None  . Food insecurity - inability: None  . Transportation needs - medical: None  . Transportation needs - non-medical: None  Occupational History  . 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 Smoking status: Never Smoker  . Smokeless tobacco: Never Used  Substance and Sexual Activity  . Alcohol use: No    Alcohol/week: 0.0 oz  . Drug use: No  . Sexual activity: Yes    Birth control/protection: Post-menopausal  Other Topics Concern  . None  Social History Narrative   Lives with Curt Bears daughter    Valentino Saxon to walk    Family History  Problem Relation Age of Onset  . Kidney disease Mother 35       kidney failure  . Hypertension Mother   . Diabetes Mother   . Arthritis Mother   . Depression Mother   . Alcohol abuse Father   . Cirrhosis Father   . Early death Father 48  . Diabetes Sister   . Cancer Maternal Aunt        ovary  . Cancer Maternal Grandfather  prostate

## 2017-07-24 ENCOUNTER — Ambulatory Visit: Payer: BLUE CROSS/BLUE SHIELD | Admitting: Nutrition

## 2017-08-20 ENCOUNTER — Encounter (HOSPITAL_COMMUNITY): Payer: Self-pay | Admitting: Emergency Medicine

## 2017-08-20 ENCOUNTER — Emergency Department (HOSPITAL_COMMUNITY): Payer: BLUE CROSS/BLUE SHIELD

## 2017-08-20 ENCOUNTER — Other Ambulatory Visit: Payer: Self-pay

## 2017-08-20 ENCOUNTER — Emergency Department (HOSPITAL_COMMUNITY)
Admission: EM | Admit: 2017-08-20 | Discharge: 2017-08-20 | Disposition: A | Discharge status: Home / Self Care | Payer: BLUE CROSS/BLUE SHIELD | Attending: Emergency Medicine | Admitting: Emergency Medicine

## 2017-08-20 DIAGNOSIS — R51 Headache: Secondary | ICD-10-CM | POA: Insufficient documentation

## 2017-08-20 DIAGNOSIS — Z7984 Long term (current) use of oral hypoglycemic drugs: Secondary | ICD-10-CM | POA: Insufficient documentation

## 2017-08-20 DIAGNOSIS — Z79899 Other long term (current) drug therapy: Secondary | ICD-10-CM | POA: Insufficient documentation

## 2017-08-20 DIAGNOSIS — Y999 Unspecified external cause status: Secondary | ICD-10-CM | POA: Insufficient documentation

## 2017-08-20 DIAGNOSIS — I10 Essential (primary) hypertension: Secondary | ICD-10-CM | POA: Insufficient documentation

## 2017-08-20 DIAGNOSIS — Y92410 Unspecified street and highway as the place of occurrence of the external cause: Secondary | ICD-10-CM | POA: Diagnosis not present

## 2017-08-20 DIAGNOSIS — E279 Disorder of adrenal gland, unspecified: Secondary | ICD-10-CM | POA: Insufficient documentation

## 2017-08-20 DIAGNOSIS — Y9389 Activity, other specified: Secondary | ICD-10-CM | POA: Diagnosis not present

## 2017-08-20 DIAGNOSIS — S29012A Strain of muscle and tendon of back wall of thorax, initial encounter: Secondary | ICD-10-CM | POA: Diagnosis not present

## 2017-08-20 DIAGNOSIS — S161XXA Strain of muscle, fascia and tendon at neck level, initial encounter: Secondary | ICD-10-CM | POA: Insufficient documentation

## 2017-08-20 DIAGNOSIS — E278 Other specified disorders of adrenal gland: Secondary | ICD-10-CM

## 2017-08-20 DIAGNOSIS — S199XXA Unspecified injury of neck, initial encounter: Secondary | ICD-10-CM | POA: Diagnosis present

## 2017-08-20 DIAGNOSIS — R109 Unspecified abdominal pain: Secondary | ICD-10-CM | POA: Insufficient documentation

## 2017-08-20 DIAGNOSIS — E119 Type 2 diabetes mellitus without complications: Secondary | ICD-10-CM | POA: Diagnosis not present

## 2017-08-20 LAB — COMPREHENSIVE METABOLIC PANEL
ALT: 18 U/L (ref 14–54)
AST: 12 U/L — AB (ref 15–41)
Albumin: 3.8 g/dL (ref 3.5–5.0)
Alkaline Phosphatase: 68 U/L (ref 38–126)
Anion gap: 11 (ref 5–15)
BUN: 11 mg/dL (ref 6–20)
CHLORIDE: 101 mmol/L (ref 101–111)
CO2: 27 mmol/L (ref 22–32)
Calcium: 9.5 mg/dL (ref 8.9–10.3)
Creatinine, Ser: 0.74 mg/dL (ref 0.44–1.00)
Glucose, Bld: 261 mg/dL — ABNORMAL HIGH (ref 65–99)
POTASSIUM: 3.8 mmol/L (ref 3.5–5.1)
Sodium: 139 mmol/L (ref 135–145)
Total Bilirubin: 0.5 mg/dL (ref 0.3–1.2)
Total Protein: 7.8 g/dL (ref 6.5–8.1)

## 2017-08-20 LAB — CBC WITH DIFFERENTIAL/PLATELET
Basophils Absolute: 0 10*3/uL (ref 0.0–0.1)
Basophils Relative: 0 %
EOS ABS: 0 10*3/uL (ref 0.0–0.7)
EOS PCT: 0 %
HCT: 39.7 % (ref 36.0–46.0)
Hemoglobin: 12.8 g/dL (ref 12.0–15.0)
LYMPHS PCT: 22 %
Lymphs Abs: 1.3 10*3/uL (ref 0.7–4.0)
MCH: 27.2 pg (ref 26.0–34.0)
MCHC: 32.2 g/dL (ref 30.0–36.0)
MCV: 84.5 fL (ref 78.0–100.0)
MONO ABS: 0.3 10*3/uL (ref 0.1–1.0)
Monocytes Relative: 5 %
Neutro Abs: 4.3 10*3/uL (ref 1.7–7.7)
Neutrophils Relative %: 73 %
PLATELETS: 255 10*3/uL (ref 150–400)
RBC: 4.7 MIL/uL (ref 3.87–5.11)
RDW: 13.2 % (ref 11.5–15.5)
WBC: 6 10*3/uL (ref 4.0–10.5)

## 2017-08-20 MED ORDER — IOPAMIDOL (ISOVUE-300) INJECTION 61%
100.0000 mL | Freq: Once | INTRAVENOUS | Status: AC | PRN
  Administered 2017-08-20: 100 mL via INTRAVENOUS

## 2017-08-20 MED ORDER — FENTANYL CITRATE (PF) 100 MCG/2ML IJ SOLN
50.0000 ug | Freq: Once | INTRAMUSCULAR | Status: AC
  Administered 2017-08-20: 50 ug via INTRAVENOUS

## 2017-08-20 MED ORDER — METHOCARBAMOL 500 MG PO TABS: 500.0000 mg | ORAL_TABLET | Freq: Three times a day (TID) | ORAL | 0 refills | Status: DC | PRN

## 2017-08-20 MED ORDER — FENTANYL CITRATE (PF) 100 MCG/2ML IJ SOLN
INTRAMUSCULAR | Status: AC
  Administered 2017-08-20: 50 ug via INTRAVENOUS
  Filled 2017-08-20: qty 2

## 2017-08-20 MED ORDER — HYDROCODONE-ACETAMINOPHEN 5-325 MG PO TABS: 1.0000 | ORAL_TABLET | ORAL | 0 refills | Status: DC | PRN

## 2017-08-20 NOTE — Discharge Instructions (Signed)
Follow up with your primary physician for repeat CT scan in 12 months for adrenal nodule.  Take medications as prescribed.

## 2017-08-20 NOTE — ED Notes (Signed)
Pt taken to ct 
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
## 2017-08-30 ENCOUNTER — Ambulatory Visit: Payer: BLUE CROSS/BLUE SHIELD | Admitting: Family Medicine

## 2017-08-30 ENCOUNTER — Other Ambulatory Visit: Payer: Self-pay | Admitting: Family Medicine

## 2017-08-30 ENCOUNTER — Other Ambulatory Visit: Payer: Self-pay

## 2017-08-30 ENCOUNTER — Encounter: Payer: Self-pay | Admitting: Family Medicine

## 2017-08-30 DIAGNOSIS — M545 Low back pain, unspecified: Secondary | ICD-10-CM

## 2017-08-30 DIAGNOSIS — R0789 Other chest pain: Secondary | ICD-10-CM | POA: Diagnosis not present

## 2017-08-30 DIAGNOSIS — N200 Calculus of kidney: Secondary | ICD-10-CM

## 2017-08-30 DIAGNOSIS — M542 Cervicalgia: Secondary | ICD-10-CM | POA: Diagnosis not present

## 2017-08-30 HISTORY — DX: Calculus of kidney: N20.0

## 2017-08-30 MED ORDER — ATORVASTATIN CALCIUM 40 MG PO TABS: 40.0000 mg | ORAL_TABLET | Freq: Every day | ORAL | 3 refills | Status: DC

## 2017-08-30 MED ORDER — IBUPROFEN 800 MG PO TABS: 800.0000 mg | ORAL_TABLET | Freq: Three times a day (TID) | ORAL | 1 refills | Status: AC | PRN

## 2017-08-30 MED ORDER — CYCLOBENZAPRINE HCL 5 MG PO TABS: 5.0000 mg | ORAL_TABLET | Freq: Three times a day (TID) | ORAL | 1 refills | Status: AC | PRN

## 2017-08-30 NOTE — Telephone Encounter (Signed)
Pt is out of Atorvastan can you call that in
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
## 2017-08-30 NOTE — Patient Instructions (Addendum)
Continue to rest I am referring you to an orthopedic for consultation Take the ibuprofen every 6-8 hours with food May take 3 times a day Take the cyclobenzaprine as a muscle relaxer Take this to sleep, may cause drowsiness

## 2017-09-05 ENCOUNTER — Ambulatory Visit: Payer: BLUE CROSS/BLUE SHIELD | Admitting: Family Medicine

## 2017-09-13 ENCOUNTER — Telehealth: Payer: Self-pay

## 2017-09-13 NOTE — Telephone Encounter (Signed)
Pt called requesting that we follow up with her appt for Ortho.  She has not herd anything from the Ortho office yet. Can you call them.

## 2017-09-16 ENCOUNTER — Telehealth: Payer: Self-pay | Admitting: Orthopedic Surgery

## 2017-09-16 NOTE — Telephone Encounter (Signed)
I have requesting an update from [place].
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
## 2017-09-17 NOTE — Telephone Encounter (Signed)
Now not seeing new patients who would need further follow up (until return in May).
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
## 2017-09-17 NOTE — Telephone Encounter (Signed)
Can Dr Raliegh Ip see this patient ??

## 2017-09-18 NOTE — Telephone Encounter (Signed)
Called back to patient, left message.

## 2017-09-19 ENCOUNTER — Telehealth: Payer: Self-pay | Admitting: Family Medicine

## 2017-09-19 NOTE — Telephone Encounter (Signed)
Sent to [employer], [place] location

## 2017-09-19 NOTE — Telephone Encounter (Signed)
Calling in regards to the referral, she states that Dr Aline Brochure cant see her till the middle of march, and she needs to see someone before then--[phone]

## 2017-09-24 NOTE — Telephone Encounter (Signed)
Patient aware, notes had been entered into referral.

## 2017-10-02 ENCOUNTER — Ambulatory Visit (INDEPENDENT_AMBULATORY_CARE_PROVIDER_SITE_OTHER): Payer: BLUE CROSS/BLUE SHIELD | Admitting: Orthopaedic Surgery

## 2017-10-02 ENCOUNTER — Encounter (INDEPENDENT_AMBULATORY_CARE_PROVIDER_SITE_OTHER): Payer: Self-pay | Admitting: Orthopaedic Surgery

## 2017-10-02 DIAGNOSIS — M542 Cervicalgia: Secondary | ICD-10-CM | POA: Diagnosis not present
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
## 2017-10-07 ENCOUNTER — Encounter: Payer: Self-pay | Admitting: Family Medicine

## 2017-10-17 ENCOUNTER — Ambulatory Visit (HOSPITAL_COMMUNITY): Payer: BLUE CROSS/BLUE SHIELD | Attending: Orthopaedic Surgery

## 2017-10-17 ENCOUNTER — Encounter (HOSPITAL_COMMUNITY): Payer: Self-pay

## 2017-10-17 ENCOUNTER — Other Ambulatory Visit: Payer: Self-pay

## 2017-10-17 DIAGNOSIS — M25511 Pain in right shoulder: Secondary | ICD-10-CM | POA: Insufficient documentation

## 2017-10-17 DIAGNOSIS — R29898 Other symptoms and signs involving the musculoskeletal system: Secondary | ICD-10-CM | POA: Diagnosis present

## 2017-10-17 DIAGNOSIS — M62830 Muscle spasm of back: Secondary | ICD-10-CM | POA: Insufficient documentation

## 2017-10-17 DIAGNOSIS — M542 Cervicalgia: Secondary | ICD-10-CM | POA: Insufficient documentation
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
## 2017-10-18 ENCOUNTER — Ambulatory Visit: Payer: BLUE CROSS/BLUE SHIELD | Admitting: Family Medicine

## 2017-10-18 ENCOUNTER — Encounter: Payer: Self-pay | Admitting: Family Medicine

## 2017-10-18 VITALS — BP 130/88 | HR 78 | Resp 16 | Ht 60.0 in | Wt 197.0 lb

## 2017-10-18 DIAGNOSIS — R0781 Pleurodynia: Secondary | ICD-10-CM | POA: Diagnosis not present

## 2017-10-18 DIAGNOSIS — K219 Gastro-esophageal reflux disease without esophagitis: Secondary | ICD-10-CM

## 2017-10-18 DIAGNOSIS — M542 Cervicalgia: Secondary | ICD-10-CM

## 2017-10-18 MED ORDER — OMEPRAZOLE 40 MG PO CPDR: 40.0000 mg | DELAYED_RELEASE_CAPSULE | Freq: Every day | ORAL | 1 refills | Status: DC
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
## 2017-10-18 NOTE — Patient Instructions (Signed)
Take the omeprazole once a day on an empty stomach  Continue other treatment  Use the neck brace for comfort  See me in [facility]

## 2017-10-22 ENCOUNTER — Encounter (HOSPITAL_COMMUNITY): Payer: Self-pay | Admitting: Physical Therapy

## 2017-10-22 ENCOUNTER — Ambulatory Visit (HOSPITAL_COMMUNITY): Payer: BLUE CROSS/BLUE SHIELD | Admitting: Physical Therapy

## 2017-10-22 DIAGNOSIS — M542 Cervicalgia: Secondary | ICD-10-CM

## 2017-10-22 DIAGNOSIS — M25511 Pain in right shoulder: Secondary | ICD-10-CM

## 2017-10-22 DIAGNOSIS — M62830 Muscle spasm of back: Secondary | ICD-10-CM

## 2017-10-22 DIAGNOSIS — R29898 Other symptoms and signs involving the musculoskeletal system: Secondary | ICD-10-CM

## 2017-10-22 NOTE — Patient Instructions (Signed)
  Sternocleidomastoid (SCM) Stretch In sitting, place one hand over the front of your collarbone, then extend your head backwards and to each side.  Repeat 3 Times Hold 20 Seconds Complete 3 Sets Perform 1 Time(s) a Day   RETRACTION / CHIN TUCK Slowly draw your head back so that your ears line up with your shoulders.  Repeat 10 Times Hold 5 Seconds Complete 1 Set Perform 1 Time(s) a Day

## 2017-10-22 NOTE — Therapy (Signed)
[place], [place], [address] Phone: [phone]   Fax:  [phone]  Physical Therapy Treatment  Patient Details  Name: [name] MRN: [id number] Date of Birth: [birth year] Referring Provider: Garald Balding, MD   Encounter Date: 10/22/2017  PT End of Session - 10/22/17 0946    Visit Number  2    Number of Visits  13    Date for PT Re-Evaluation  11/28/17 mini-re-assess 11/07/17    Authorization Type  [employer]    Authorization Time Period  10/17/2017 - 11/28/2017    PT Start Time  0817    PT Stop Time  0900    PT Time Calculation (min)  43 min    Activity Tolerance  Patient tolerated treatment well;No increased pain    Behavior During Therapy  WFL for tasks assessed/performed       Past Medical History:  Diagnosis Date  . Bilateral kidney stones 08/30/2017  . Depression   . Diabetes mellitus without complication ([place])   . Fatigue   . Fibroids, intramural 06/12/2017   Has subserosal and submucosal and intramural fibroids   . Hyperlipidemia   . Hypertension   . Snoring   . Thickened endometrium 06/12/2017   Will get endo bx    Past Surgical History:  Procedure Laterality Date  . CESAREAN SECTION     2  . FOOT SURGERY      There were no vitals filed for this visit.  Subjective Assessment - 10/22/17 0819    Subjective  Patient reported that her neck is not bothering her right now, but that she has a dull pain in her middle back.     Limitations  Sitting;Reading;Lifting;Standing;Walking;House hold activities    Diagnostic tests  x-ray/MRI - clearing her cervical spine and thoracic spine, no abnormal findings or acute injuries    Patient Stated Goals  have less pain    Pain Score  6     Pain Location  Back    Pain Orientation  Mid    Pain Descriptors / Indicators  Dull    Pain Type  Chronic pain    Pain Onset  More than a month ago    Pain Frequency  Intermittent     Multiple Pain Sites  No                No data recorded       [facility] Adult PT Treatment/Exercise - 10/22/17 0001      Neck Exercises: Seated   Cervical Isometrics  Right lateral flexion;Left lateral flexion;10 reps;5 secs;Other (comment);Right rotation;Left rotation seated    Neck Retraction  10 reps;5 secs    Neck Retraction Limitations  Verbal and tactile cues for form      Neck Exercises: Supine   Neck Retraction  10 reps;Limitations    Neck Retraction Limitations  5 second holds      Manual Therapy   Manual Therapy  Soft tissue mobilization    Manual therapy comments  10 minutes total Completed separately from all other skilled interventions    Soft tissue mobilization  Patient prone with bolster under ankles and neck portion of mat lowered for comfort, soft tissue mobilization to right cervical and thoracic paraspinals to patient's tolerance      Neck Exercises: Stretches   Upper Trapezius Stretch  Right;Left;3 reps;30 seconds    Upper Trapezius Stretch Limitations  Seated    Other Neck Stretches  Sternocleidomastoid  stretch 3 x 20 each side seated    Other Neck Stretches  Seated             PT Education - 10/22/17 0946    Education provided  Yes    Education Details  Patient was educated on goals and on home exercise program, provided tactile and verbal cues for exercises throughut session. Also educated on benefits of soft tissue mobilization.     Person(s) Educated  Patient    Methods  Explanation;Handout;Tactile cues;Verbal cues;Demonstration    Comprehension  Verbalized understanding;Returned demonstration;Verbal cues required;Tactile cues required;Need further instruction       PT Short Term Goals - 10/18/17 1244      PT SHORT TERM GOAL #1   Title  Patient will be independent with HEP to improve functional ROM for cervical spine and postural muscel endurance ot reduce pain and improve QOL.    Time  3    Period  Weeks    Status  New    Target  Date  11/07/17      PT SHORT TERM GOAL #2   Title  Patient will improvecervical ROM by 8 degrees in all limited directions to indicate significant improvement for greater ease of performance iwth ADL's such as dressing, bathing cooking, and driving.    Time  3    Period  Weeks    Status  New      PT SHORT TERM GOAL #3   Title  Patient will improve cervical flexor endurance test by 10 seconds to demosntrate improved cervical activation to improve posture throughout daily acitivties and demonstrate increased functional muscle endurance.    Time  3    Period  Weeks    Status  New        PT Long Term Goals - 10/18/17 1252      PT LONG TERM GOAL #1   Title  Patient will improvecervical ROM by 15 degrees in all limited directions to indicate significant improvement for greater ease of performance with ADL's such as dressing, bathing cooking, and driving.    Time  6    Period  Weeks    Status  New    Target Date  11/28/17      PT LONG TERM GOAL #2   Title  Patient will improve cervical flexor endurance test to 30 seconds to demosntrate improved cervical activation to improve posture throughout daily acitivties and demonstrate increased functional muscle endurance.    Time  6    Period  Weeks    Status  New      PT LONG TERM GOAL #3   Title  Patient will demonstrate 12 point improvement in NDI to demonstrate recuded disability wth functional activities through patient self report to show improved QOL.    Time  6    Period  Weeks    Status  New      PT LONG TERM GOAL #4   Title  Patient will report a maximum of 3/10 through the last week and have no pain with AROM testing of cervical spine or with resisted isometrics indicating decreased in irritability of pain for improved QOL.    Time  6    Period  Weeks    Status  New            Plan - 10/22/17 0947    Clinical Impression Statement  This session initiated with a review of patient's evaluation and goals. Discussed with  patient how the sessions  would address back pain while addressing cervical restrictions and how they are interrelated. Session then progressed to patient performing supine cervical retraction. Then patient performed seated cervical stretches. Patient then performed cervical retraction in sitting and cervical isometrics for lateral flexion and rotation bilaterally. Session ended with soft tissue mobilization to patient's right cervical and thoracic paraspinals, patient stated that this did decrease her pain some, but did not quantify.    Rehab Potential  Fair    Clinical Impairments Affecting Rehab Potential  fear or movement    PT Frequency  2x / week    PT Duration  6 weeks    PT Treatment/Interventions  ADLs/Self Care Home Management;Electrical Stimulation;Traction;Therapeutic exercise;Therapeutic activities;Functional mobility training;Neuromuscular re-education;Patient/family education;Manual techniques;Passive range of motion;Dry needling;Taping    PT Next Visit Plan  Continue supine cervical retraction exercises, seated cervical isometric exercises. Initiated postural training. Follow-up on effects of soft tissue mobilization for C spine- T spine paraspinals and continue if benefitted. Consider initiating joint mobiliztion if with PT. Follow-up on SCM stretch and chin tucks for home exercise program.     PT Home Exercise Plan  10/22/17: Seated chin tucks 10 x for 5 seconds 1x/day; Seated SCM stretch bilaterally 3x20 seconds 1x/day    Consulted and Agree with Plan of Care  Patient       Patient will benefit from skilled therapeutic intervention in order to improve the following deficits and impairments:  Decreased endurance, Decreased mobility, Hypomobility, Increased muscle spasms, Improper body mechanics, Decreased range of motion, Decreased activity tolerance, Decreased strength, Increased fascial restricitons, Impaired flexibility, Pain, Postural dysfunction  Visit  Diagnosis: Cervicalgia  Muscle spasm of back  Right shoulder pain, unspecified chronicity  Other symptoms and signs involving the musculoskeletal system     Problem List Patient Active Problem List   Diagnosis Date Noted  . Bilateral kidney stones 08/30/2017  . Fibroids, intramural 06/12/2017  . Thickened endometrium 06/12/2017  . Type 2 diabetes mellitus without complication, without long-term current use of insulin ([place]) 03/27/2017  . Essential hypertension 03/27/2017  . Hyperlipidemia LDL goal <100 03/27/2017  . OBESITY, NOS 09/26/2006    Clarene Critchley PT, DPT 9:55 AM, 10/22/17 [place], [place], [address] Phone: [phone]   Fax:  [phone]  Name: [name] MRN: [id number] Date of Birth: 06-Aug-1955

## 2017-10-24 ENCOUNTER — Encounter (HOSPITAL_COMMUNITY): Payer: BLUE CROSS/BLUE SHIELD

## 2017-10-25 ENCOUNTER — Ambulatory Visit (HOSPITAL_COMMUNITY): Payer: BLUE CROSS/BLUE SHIELD

## 2017-10-25 DIAGNOSIS — M62830 Muscle spasm of back: Secondary | ICD-10-CM

## 2017-10-25 DIAGNOSIS — M542 Cervicalgia: Secondary | ICD-10-CM | POA: Diagnosis not present

## 2017-10-25 DIAGNOSIS — M25511 Pain in right shoulder: Secondary | ICD-10-CM

## 2017-10-25 DIAGNOSIS — R29898 Other symptoms and signs involving the musculoskeletal system: Secondary | ICD-10-CM

## 2017-10-25 NOTE — Therapy (Signed)
[place], [place], [address] Phone: [phone]   Fax:  [phone]  Physical Therapy Treatment  Patient Details  Name: [name] MRN: [id number] Date of Birth: [birth year] Referring Provider: Garald Balding, MD   Encounter Date: 10/25/2017  PT End of Session - 10/25/17 1047    Visit Number  3    Number of Visits  13    Date for PT Re-Evaluation  11/28/17    Authorization Type  [employer]; 11/07/17 reassessment     Authorization Time Period  10/17/2017 - 11/28/2017    PT Start Time  1032    PT Stop Time  1110    PT Time Calculation (min)  38 min    Activity Tolerance  Patient tolerated treatment well;No increased pain    Behavior During Therapy  WFL for tasks assessed/performed       Past Medical History:  Diagnosis Date  . Bilateral kidney stones 08/30/2017  . Depression   . Diabetes mellitus without complication ([place])   . Fatigue   . Fibroids, intramural 06/12/2017   Has subserosal and submucosal and intramural fibroids   . Hyperlipidemia   . Hypertension   . Snoring   . Thickened endometrium 06/12/2017   Will get endo bx    Past Surgical History:  Procedure Laterality Date  . CESAREAN SECTION     2  . FOOT SURGERY      There were no vitals filed for this visit.  Subjective Assessment - 10/25/17 1034    Subjective  Pt reports she is feeling blessed. No cahnges since last time. Neck is feeling better today "than yesterday".     Currently in Pain?  Yes    Pain Score  6     Pain Location  Neck                No data recorded       [facility] Adult PT Treatment/Exercise - 10/25/17 0001      Exercises   Exercises  Neck      Neck Exercises: Standing   Other Standing Exercises  shoulder abduction:  3x8 c 2lb weight, tactile cues for form, posture      Neck Exercises: Seated   W Back  20 reps 3sec H     Shoulder Shrugs  15 reps    Shoulder Shrugs Limitations   right side is limited in height    Shoulder ABduction  Both;15 reps 1x15 bilat    Other Seated Exercise  10x STS: arms free for chronic pain modulation.       Neck Exercises: Supine   Neck Retraction  10 reps;Limitations;3 secs 3 pillows    Neck Retraction Limitations  5 second holds    Shoulder Flexion  20 reps;Limitations wand flexion PVC, comfortable range, wide grip     Other Supine Exercise  Hooklying on foam roll: x4 minutes arms across belly, mild tenderness to the spine from T3-L3      Manual Therapy   Manual Therapy  Myofascial release    Manual therapy comments  15 minutes     Soft tissue mobilization  seated, arm supported d/t  vertigo    Myofascial Release  Rt upper trap               PT Short Term Goals - 10/18/17 1244      PT SHORT TERM GOAL #1   Title  Patient will be independent  with HEP to improve functional ROM for cervical spine and postural muscel endurance ot reduce pain and improve QOL.    Time  3    Period  Weeks    Status  New    Target Date  11/07/17      PT SHORT TERM GOAL #2   Title  Patient will improvecervical ROM by 8 degrees in all limited directions to indicate significant improvement for greater ease of performance iwth ADL's such as dressing, bathing cooking, and driving.    Time  3    Period  Weeks    Status  New      PT SHORT TERM GOAL #3   Title  Patient will improve cervical flexor endurance test by 10 seconds to demosntrate improved cervical activation to improve posture throughout daily acitivties and demonstrate increased functional muscle endurance.    Time  3    Period  Weeks    Status  New        PT Long Term Goals - 10/18/17 1252      PT LONG TERM GOAL #1   Title  Patient will improvecervical ROM by 15 degrees in all limited directions to indicate significant improvement for greater ease of performance with ADL's such as dressing, bathing cooking, and driving.    Time  6    Period  Weeks    Status  New    Target  Date  11/28/17      PT LONG TERM GOAL #2   Title  Patient will improve cervical flexor endurance test to 30 seconds to demosntrate improved cervical activation to improve posture throughout daily acitivties and demonstrate increased functional muscle endurance.    Time  6    Period  Weeks    Status  New      PT LONG TERM GOAL #3   Title  Patient will demonstrate 12 point improvement in NDI to demonstrate recuded disability wth functional activities through patient self report to show improved QOL.    Time  6    Period  Weeks    Status  New      PT LONG TERM GOAL #4   Title  Patient will report a maximum of 3/10 through the last week and have no pain with AROM testing of cervical spine or with resisted isometrics indicating decreased in irritability of pain for improved QOL.    Time  6    Period  Weeks    Status  New            Plan - 10/25/17 1050    Clinical Impression Statement  Pt remains mildly allodynic, particularly in the right anterior shoulder, but able to complete all activities. Continued to progress AROM, postural education adn strengthening, and including some shoulder activity to promote cervical bracing/isometrics.     Rehab Potential  Fair    Clinical Impairments Affecting Rehab Potential  mild kinesiophobia improving    PT Frequency  2x / week    PT Duration  6 weeks    PT Treatment/Interventions  ADLs/Self Care Home Management;Electrical Stimulation;Traction;Therapeutic exercise;Therapeutic activities;Functional mobility training;Neuromuscular re-education;Patient/family education;Manual techniques;Passive range of motion;Dry needling;Taping    PT Next Visit Plan  cotninue with postural educaiton and strengthening, STM as needed for pain management, shoulder program for cervical isometrics.     PT Home Exercise Plan  10/22/17: Seated chin tucks 10 x for 5 seconds 1x/day; Seated SCM stretch bilaterally 3x20 seconds 1x/day    Consulted and Agree with Plan  of Care   Patient       Patient will benefit from skilled therapeutic intervention in order to improve the following deficits and impairments:  Decreased endurance, Decreased mobility, Hypomobility, Increased muscle spasms, Improper body mechanics, Decreased range of motion, Decreased activity tolerance, Decreased strength, Increased fascial restricitons, Impaired flexibility, Pain, Postural dysfunction  Visit Diagnosis: Cervicalgia  Muscle spasm of back  Right shoulder pain, unspecified chronicity  Other symptoms and signs involving the musculoskeletal system     Problem List Patient Active Problem List   Diagnosis Date Noted  . Bilateral kidney stones 08/30/2017  . Fibroids, intramural 06/12/2017  . Thickened endometrium 06/12/2017  . Type 2 diabetes mellitus without complication, without long-term current use of insulin ([place]) 03/27/2017  . Essential hypertension 03/27/2017  . Hyperlipidemia LDL goal <100 03/27/2017  . OBESITY, NOS 09/26/2006   11:10 AM, 10/25/17 Etta Grandchild, PT, DPT Physical Therapist - [place] [phone] (Office)    Etta Grandchild 10/25/2017, 11:10 AM  [place] [address] [place], [place], [address] Phone: [phone]   Fax:  [phone]  Name: Dewana Ammirati MRN: [id number] Date of Birth: 1956-06-24

## 2017-10-29 ENCOUNTER — Ambulatory Visit (HOSPITAL_COMMUNITY): Payer: BLUE CROSS/BLUE SHIELD | Attending: Orthopaedic Surgery | Admitting: Physical Therapy

## 2017-10-29 DIAGNOSIS — M542 Cervicalgia: Secondary | ICD-10-CM | POA: Diagnosis present

## 2017-10-29 DIAGNOSIS — R29898 Other symptoms and signs involving the musculoskeletal system: Secondary | ICD-10-CM | POA: Insufficient documentation

## 2017-10-29 DIAGNOSIS — M62830 Muscle spasm of back: Secondary | ICD-10-CM | POA: Diagnosis present

## 2017-10-29 DIAGNOSIS — M25511 Pain in right shoulder: Secondary | ICD-10-CM | POA: Insufficient documentation

## 2017-10-29 NOTE — Therapy (Signed)
[place], [place], [address] Phone: [phone]   Fax:  [phone]  Physical Therapy Treatment  Patient Details  Name: [name] MRN: [id number] Date of Birth: 05-23-1956 Referring Provider: Garald Balding, MD   Encounter Date: 10/29/2017  PT End of Session - 10/29/17 0956    Visit Number  4    Number of Visits  13    Date for PT Re-Evaluation  11/28/17    Authorization Type  [employer]; 11/07/17 reassessment     Authorization Time Period  10/17/2017 - 11/28/2017    PT Start Time  0820    PT Stop Time  0900    PT Time Calculation (min)  40 min    Activity Tolerance  Patient tolerated treatment well;No increased pain    Behavior During Therapy  WFL for tasks assessed/performed       Past Medical History:  Diagnosis Date  . Bilateral kidney stones 08/30/2017  . Depression   . Diabetes mellitus without complication ([place])   . Fatigue   . Fibroids, intramural 06/12/2017   Has subserosal and submucosal and intramural fibroids   . Hyperlipidemia   . Hypertension   . Snoring   . Thickened endometrium 06/12/2017   Will get endo bx    Past Surgical History:  Procedure Laterality Date  . CESAREAN SECTION     2  . FOOT SURGERY      There were no vitals filed for this visit.  Subjective Assessment - 10/29/17 1002    Subjective  Pt states she only hurts on her Rt side.  States it burns in her shoulder sometimes.  Currently 5/10.    Currently in Pain?  Yes    Pain Score  5     Pain Location  Shoulder    Pain Orientation  Right    Pain Descriptors / Indicators  Dull;Aching;Burning                       [facility] Adult PT Treatment/Exercise - 10/29/17 0001      Neck Exercises: Theraband   Scapula Retraction  10 reps;Red    Shoulder Extension  10 reps;Red    Rows  10 reps;Red      Neck Exercises: Seated   X to V  15 reps    W Back  20 reps    Shoulder Shrugs  15 reps     Shoulder ABduction  Both;15 reps    Other Seated Exercise  10x STS: arms free      Neck Exercises: Supine   Neck Retraction  10 reps;Limitations;3 secs    Neck Retraction Limitations  5 second holds    Shoulder Flexion  20 reps;Limitations      Manual Therapy   Manual Therapy  Soft tissue mobilization    Manual therapy comments  completed seperately from all other skilled interventions    Soft tissue mobilization  seated, arm supported d/t  vertigo    Myofascial Release  Rt upper trap               PT Short Term Goals - 10/18/17 1244      PT SHORT TERM GOAL #1   Title  Patient will be independent with HEP to improve functional ROM for cervical spine and postural muscel endurance ot reduce pain and improve QOL.    Time  3    Period  Weeks  Status  New    Target Date  11/07/17      PT SHORT TERM GOAL #2   Title  Patient will improvecervical ROM by 8 degrees in all limited directions to indicate significant improvement for greater ease of performance iwth ADL's such as dressing, bathing cooking, and driving.    Time  3    Period  Weeks    Status  New      PT SHORT TERM GOAL #3   Title  Patient will improve cervical flexor endurance test by 10 seconds to demosntrate improved cervical activation to improve posture throughout daily acitivties and demonstrate increased functional muscle endurance.    Time  3    Period  Weeks    Status  New        PT Long Term Goals - 10/18/17 1252      PT LONG TERM GOAL #1   Title  Patient will improvecervical ROM by 15 degrees in all limited directions to indicate significant improvement for greater ease of performance with ADL's such as dressing, bathing cooking, and driving.    Time  6    Period  Weeks    Status  New    Target Date  11/28/17      PT LONG TERM GOAL #2   Title  Patient will improve cervical flexor endurance test to 30 seconds to demosntrate improved cervical activation to improve posture throughout daily  acitivties and demonstrate increased functional muscle endurance.    Time  6    Period  Weeks    Status  New      PT LONG TERM GOAL #3   Title  Patient will demonstrate 12 point improvement in NDI to demonstrate recuded disability wth functional activities through patient self report to show improved QOL.    Time  6    Period  Weeks    Status  New      PT LONG TERM GOAL #4   Title  Patient will report a maximum of 3/10 through the last week and have no pain with AROM testing of cervical spine or with resisted isometrics indicating decreased in irritability of pain for improved QOL.    Time  6    Period  Weeks    Status  New            Plan - 10/29/17 0956    Clinical Impression Statement  Began postural strengthening today using theraband with manual and verbal cues for form.  Completed all other established therex wtih continued need of cues for form.  Manual to Rt upper and middle trap region with hypersensitivity and difficulty relaxing with deeper muscle palpation.  Unable to complete myofascial as too reactive.  soft tissue completed revealing general tightness but no mm spasms.  Pt actually reported higher pain level at EOS due to manual, up to 7/10, however reports it becomes loower as soreness wears off.     Rehab Potential  Fair    Clinical Impairments Affecting Rehab Potential  mild kinesiophobia improving    PT Frequency  2x / week    PT Duration  6 weeks    PT Treatment/Interventions  ADLs/Self Care Home Management;Electrical Stimulation;Traction;Therapeutic exercise;Therapeutic activities;Functional mobility training;Neuromuscular re-education;Patient/family education;Manual techniques;Passive range of motion;Dry needling;Taping    PT Next Visit Plan  cotninue with postural educaiton and strengthening, STM as needed for pain management, shoulder program for cervical isometrics.     PT Home Exercise Plan  10/22/17: Seated chin tucks 10  x for 5 seconds 1x/day; Seated SCM  stretch bilaterally 3x20 seconds 1x/day    Consulted and Agree with Plan of Care  Patient       Patient will benefit from skilled therapeutic intervention in order to improve the following deficits and impairments:  Decreased endurance, Decreased mobility, Hypomobility, Increased muscle spasms, Improper body mechanics, Decreased range of motion, Decreased activity tolerance, Decreased strength, Increased fascial restricitons, Impaired flexibility, Pain, Postural dysfunction  Visit Diagnosis: Cervicalgia  Muscle spasm of back  Right shoulder pain, unspecified chronicity  Other symptoms and signs involving the musculoskeletal system     Problem List Patient Active Problem List   Diagnosis Date Noted  . Bilateral kidney stones 08/30/2017  . Fibroids, intramural 06/12/2017  . Thickened endometrium 06/12/2017  . Type 2 diabetes mellitus without complication, without long-term current use of insulin ([place]) 03/27/2017  . Essential hypertension 03/27/2017  . Hyperlipidemia LDL goal <100 03/27/2017  . OBESITY, NOS 09/26/2006   Teena Irani, PTA/CLT [phone]  Teena Irani 10/29/2017, 10:03 AM  [facility] [address] [place], [place], [address] Phone: [phone]   Fax:  [phone]  Name: Janda Cargo MRN: [id number] Date of Birth: [birth year]

## 2017-10-31 ENCOUNTER — Other Ambulatory Visit: Payer: Self-pay | Admitting: Family Medicine

## 2017-10-31 ENCOUNTER — Ambulatory Visit (HOSPITAL_COMMUNITY): Payer: BLUE CROSS/BLUE SHIELD | Admitting: Physical Therapy

## 2017-10-31 DIAGNOSIS — M542 Cervicalgia: Secondary | ICD-10-CM

## 2017-10-31 DIAGNOSIS — R29898 Other symptoms and signs involving the musculoskeletal system: Secondary | ICD-10-CM

## 2017-10-31 DIAGNOSIS — M25511 Pain in right shoulder: Secondary | ICD-10-CM

## 2017-10-31 DIAGNOSIS — M62830 Muscle spasm of back: Secondary | ICD-10-CM

## 2017-10-31 NOTE — Therapy (Signed)
[place], [place], [address] Phone: [phone]   Fax:  [phone]  Physical Therapy Treatment  Patient Details  Name: [name] MRN: [id number] Date of Birth: 28-Feb-1956 Referring Provider: Garald Balding, MD   Encounter Date: 10/31/2017  PT End of Session - 10/31/17 0848    Visit Number  5    Number of Visits  13    Date for PT Re-Evaluation  11/28/17    Authorization Type  [employer]; 11/07/17 reassessment     Authorization Time Period  10/17/2017 - 11/28/2017    PT Start Time  2482    PT Stop Time  0900    PT Time Calculation (min)  38 min    Activity Tolerance  Patient tolerated treatment well;No increased pain    Behavior During Therapy  WFL for tasks assessed/performed       Past Medical History:  Diagnosis Date  . Bilateral kidney stones 08/30/2017  . Depression   . Diabetes mellitus without complication ([place])   . Fatigue   . Fibroids, intramural 06/12/2017   Has subserosal and submucosal and intramural fibroids   . Hyperlipidemia   . Hypertension   . Snoring   . Thickened endometrium 06/12/2017   Will get endo bx    Past Surgical History:  Procedure Laterality Date  . CESAREAN SECTION     2  . FOOT SURGERY      There were no vitals filed for this visit.  Subjective Assessment - 10/31/17 0838    Subjective  Pt states her pain is about the same today, still on the Rt side and increases with movement reporting increase to 7/10 with movement.  6/10 when sitting in waiting room.      Currently in Pain?  Yes    Pain Score  6     Pain Location  Shoulder    Pain Orientation  Right    Pain Descriptors / Indicators  Dull;Aching;Burning    Pain Radiating Towards  lower back    Aggravating Factors   movement    Pain Relieving Factors  being still                       [facility] Adult PT Treatment/Exercise - 10/31/17 0001      Neck Exercises: Machines for  Strengthening   UBE (Upper Arm Bike)  3 minutes backward level 1      Neck Exercises: Theraband   Scapula Retraction  10 reps;Red    Shoulder Extension  10 reps;Red    Rows  10 reps;Red      Neck Exercises: Seated   Neck Retraction  10 reps;5 secs    X to V  15 reps    W Back  20 reps    Shoulder Shrugs  15 reps    Shoulder ABduction  Both;15 reps    Other Seated Exercise  10x STS: arms free      Manual Therapy   Manual Therapy  Soft tissue mobilization    Manual therapy comments  completed seperately from all other skilled interventions    Soft tissue mobilization  seated, arm supported d/t  vertigo    Myofascial Release  Rt upper trap      Neck Exercises: Stretches   Upper Trapezius Stretch  Right;Left;2 reps;20 seconds    Upper Trapezius Stretch Limitations  Seated    Corner Stretch  3 reps;20 seconds  PT Education - 10/31/17 0902    Education provided  Yes    Education Details  Pt wtih questions regarding heating pad.  Informed she could try for no more than 20 minutes at at time.   Instructed to contiue therex and stretches.     Person(s) Educated  Patient    Methods  Explanation    Comprehension  Verbalized understanding       PT Short Term Goals - 10/18/17 1244      PT SHORT TERM GOAL #1   Title  Patient will be independent with HEP to improve functional ROM for cervical spine and postural muscel endurance ot reduce pain and improve QOL.    Time  3    Period  Weeks    Status  New    Target Date  11/07/17      PT SHORT TERM GOAL #2   Title  Patient will improvecervical ROM by 8 degrees in all limited directions to indicate significant improvement for greater ease of performance iwth ADL's such as dressing, bathing cooking, and driving.    Time  3    Period  Weeks    Status  New      PT SHORT TERM GOAL #3   Title  Patient will improve cervical flexor endurance test by 10 seconds to demosntrate improved cervical activation to improve posture  throughout daily acitivties and demonstrate increased functional muscle endurance.    Time  3    Period  Weeks    Status  New        PT Long Term Goals - 10/18/17 1252      PT LONG TERM GOAL #1   Title  Patient will improvecervical ROM by 15 degrees in all limited directions to indicate significant improvement for greater ease of performance with ADL's such as dressing, bathing cooking, and driving.    Time  6    Period  Weeks    Status  New    Target Date  11/28/17      PT LONG TERM GOAL #2   Title  Patient will improve cervical flexor endurance test to 30 seconds to demosntrate improved cervical activation to improve posture throughout daily acitivties and demonstrate increased functional muscle endurance.    Time  6    Period  Weeks    Status  New      PT LONG TERM GOAL #3   Title  Patient will demonstrate 12 point improvement in NDI to demonstrate recuded disability wth functional activities through patient self report to show improved QOL.    Time  6    Period  Weeks    Status  New      PT LONG TERM GOAL #4   Title  Patient will report a maximum of 3/10 through the last week and have no pain with AROM testing of cervical spine or with resisted isometrics indicating decreased in irritability of pain for improved QOL.    Time  6    Period  Weeks    Status  New            Plan - 10/31/17 0849    Clinical Impression Statement  Continued with established therex with patient needing extra manual/verbal cues with theraband exercises.  Added corner stretch to help open up chest and improve posture.    Pt continues to be hyper reactive to manual techniques unable to tolerate deeper tissue release.  Large spasm remains in Rt scapular region.  Able  to decrease but unable to resolve.      Rehab Potential  Fair    Clinical Impairments Affecting Rehab Potential  mild kinesiophobia improving    PT Frequency  2x / week    PT Duration  6 weeks    PT Treatment/Interventions   ADLs/Self Care Home Management;Electrical Stimulation;Traction;Therapeutic exercise;Therapeutic activities;Functional mobility training;Neuromuscular re-education;Patient/family education;Manual techniques;Passive range of motion;Dry needling;Taping    PT Next Visit Plan  cotninue with postural educaiton and strengthening, STM as needed for pain management, shoulder program for cervical isometrics. Next session begin wall arches and UE flexion against wall working on posture and stabilization.     PT Home Exercise Plan  10/22/17: Seated chin tucks 10 x for 5 seconds 1x/day; Seated SCM stretch bilaterally 3x20 seconds 1x/day    Consulted and Agree with Plan of Care  Patient       Patient will benefit from skilled therapeutic intervention in order to improve the following deficits and impairments:  Decreased endurance, Decreased mobility, Hypomobility, Increased muscle spasms, Improper body mechanics, Decreased range of motion, Decreased activity tolerance, Decreased strength, Increased fascial restricitons, Impaired flexibility, Pain, Postural dysfunction  Visit Diagnosis: Cervicalgia  Muscle spasm of back  Right shoulder pain, unspecified chronicity  Other symptoms and signs involving the musculoskeletal system     Problem List Patient Active Problem List   Diagnosis Date Noted  . Bilateral kidney stones 08/30/2017  . Fibroids, intramural 06/12/2017  . Thickened endometrium 06/12/2017  . Type 2 diabetes mellitus without complication, without long-term current use of insulin ([place]) 03/27/2017  . Essential hypertension 03/27/2017  . Hyperlipidemia LDL goal <100 03/27/2017  . OBESITY, NOS 09/26/2006   Teena Irani, PTA/CLT [phone]  Teena Irani 10/31/2017, 9:03 AM  [facility] [address] [place], [place], [address] Phone: [phone]   Fax:  [phone]  Name: [name] MRN: [id number] Date of Birth: 26-Nov-1955

## 2017-11-05 ENCOUNTER — Ambulatory Visit (HOSPITAL_COMMUNITY): Payer: BLUE CROSS/BLUE SHIELD | Admitting: Physical Therapy

## 2017-11-05 DIAGNOSIS — M542 Cervicalgia: Secondary | ICD-10-CM | POA: Diagnosis not present

## 2017-11-05 DIAGNOSIS — R29898 Other symptoms and signs involving the musculoskeletal system: Secondary | ICD-10-CM

## 2017-11-05 DIAGNOSIS — M62830 Muscle spasm of back: Secondary | ICD-10-CM

## 2017-11-05 DIAGNOSIS — M25511 Pain in right shoulder: Secondary | ICD-10-CM

## 2017-11-05 NOTE — Therapy (Signed)
[place], [place], [address] Phone: [phone]   Fax:  [phone]  Physical Therapy Treatment  Patient Details  Name: [name] MRN: [id number] Date of Birth: Nov 16, 1955 Referring Provider: Garald Balding, MD   Encounter Date: 11/05/2017  PT End of Session - 11/05/17 0928    Visit Number  6    Number of Visits  13    Date for PT Re-Evaluation  11/28/17    Authorization Type  [employer]; 11/07/17 reassessment     Authorization Time Period  10/17/2017 - 11/28/2017    PT Start Time  0818    PT Stop Time  0902    PT Time Calculation (min)  44 min    Activity Tolerance  Patient tolerated treatment well;No increased pain    Behavior During Therapy  WFL for tasks assessed/performed       Past Medical History:  Diagnosis Date  . Bilateral kidney stones 08/30/2017  . Depression   . Diabetes mellitus without complication ([place])   . Fatigue   . Fibroids, intramural 06/12/2017   Has subserosal and submucosal and intramural fibroids   . Hyperlipidemia   . Hypertension   . Snoring   . Thickened endometrium 06/12/2017   Will get endo bx    Past Surgical History:  Procedure Laterality Date  . CESAREAN SECTION     2  . FOOT SURGERY      There were no vitals filed for this visit.  Subjective Assessment - 11/05/17 0826    Subjective  Pt states everything is hurting worse today and she doesn't know why.  Currently 7/10 Rt UT with movement.  Little to no pain when at rest.     Currently in Pain?  Yes    Pain Score  7     Pain Location  Shoulder    Pain Orientation  Right    Pain Descriptors / Indicators  Aching;Burning                       [facility] Adult PT Treatment/Exercise - 11/05/17 0001      Neck Exercises: Theraband   Scapula Retraction  Red;15 reps    Shoulder Extension  Red;15 reps    Rows  Red;15 reps      Neck Exercises: Standing   Other Standing Exercises   wall arches 10 reps    Other Standing Exercises  UE flexion against wall 10 reps      Neck Exercises: Seated   X to V  15 reps    W Back  20 reps    Shoulder ABduction  Both;15 reps    Other Seated Exercise  10x STS: arms free      Manual Therapy   Manual Therapy  Soft tissue mobilization    Manual therapy comments  completed seperately from all other skilled interventions    Soft tissue mobilization  seated, arm supported d/t  vertigo    Myofascial Release  Rt upper trap               PT Short Term Goals - 10/18/17 1244      PT SHORT TERM GOAL #1   Title  Patient will be independent with HEP to improve functional ROM for cervical spine and postural muscel endurance ot reduce pain and improve QOL.    Time  3    Period  Weeks    Status  New  Target Date  11/07/17      PT SHORT TERM GOAL #2   Title  Patient will improvecervical ROM by 8 degrees in all limited directions to indicate significant improvement for greater ease of performance iwth ADL's such as dressing, bathing cooking, and driving.    Time  3    Period  Weeks    Status  New      PT SHORT TERM GOAL #3   Title  Patient will improve cervical flexor endurance test by 10 seconds to demosntrate improved cervical activation to improve posture throughout daily acitivties and demonstrate increased functional muscle endurance.    Time  3    Period  Weeks    Status  New        PT Long Term Goals - 10/18/17 1252      PT LONG TERM GOAL #1   Title  Patient will improvecervical ROM by 15 degrees in all limited directions to indicate significant improvement for greater ease of performance with ADL's such as dressing, bathing cooking, and driving.    Time  6    Period  Weeks    Status  New    Target Date  11/28/17      PT LONG TERM GOAL #2   Title  Patient will improve cervical flexor endurance test to 30 seconds to demosntrate improved cervical activation to improve posture throughout daily acitivties and  demonstrate increased functional muscle endurance.    Time  6    Period  Weeks    Status  New      PT LONG TERM GOAL #3   Title  Patient will demonstrate 12 point improvement in NDI to demonstrate recuded disability wth functional activities through patient self report to show improved QOL.    Time  6    Period  Weeks    Status  New      PT LONG TERM GOAL #4   Title  Patient will report a maximum of 3/10 through the last week and have no pain with AROM testing of cervical spine or with resisted isometrics indicating decreased in irritability of pain for improved QOL.    Time  6    Period  Weeks    Status  New            Plan - 11/05/17 0929    Clinical Impression Statement  Contniued with focus on improving UE strength/stab and improving pain.  Added standing UE flexion and wall arches with cues for form.  Pt continues to require both manual and tactile cues to complete theraband exercises in correct form.  Pt less reactive today to manual and less tightness/spasm also noted.  Pt reported overall improvement at EOS.    Rehab Potential  Fair    Clinical Impairments Affecting Rehab Potential  mild kinesiophobia improving    PT Frequency  2x / week    PT Duration  6 weeks    PT Treatment/Interventions  ADLs/Self Care Home Management;Electrical Stimulation;Traction;Therapeutic exercise;Therapeutic activities;Functional mobility training;Neuromuscular re-education;Patient/family education;Manual techniques;Passive range of motion;Dry needling;Taping    PT Next Visit Plan  cotninue with postural education and strengthening, STM as needed for pain management.  Progress to prone exercises.    PT Home Exercise Plan  10/22/17: Seated chin tucks 10 x for 5 seconds 1x/day; Seated SCM stretch bilaterally 3x20 seconds 1x/day    Consulted and Agree with Plan of Care  Patient       Patient will benefit from skilled therapeutic intervention  in order to improve the following deficits and  impairments:  Decreased endurance, Decreased mobility, Hypomobility, Increased muscle spasms, Improper body mechanics, Decreased range of motion, Decreased activity tolerance, Decreased strength, Increased fascial restricitons, Impaired flexibility, Pain, Postural dysfunction  Visit Diagnosis: Cervicalgia  Muscle spasm of back  Right shoulder pain, unspecified chronicity  Other symptoms and signs involving the musculoskeletal system     Problem List Patient Active Problem List   Diagnosis Date Noted  . Bilateral kidney stones 08/30/2017  . Fibroids, intramural 06/12/2017  . Thickened endometrium 06/12/2017  . Type 2 diabetes mellitus without complication, without long-term current use of insulin ([place]) 03/27/2017  . Essential hypertension 03/27/2017  . Hyperlipidemia LDL goal <100 03/27/2017  . OBESITY, NOS 09/26/2006   Teena Irani, PTA/CLT [phone]  Teena Irani 11/05/2017, 9:32 AM  [facility] [address] [place], [place], [address] Phone: [phone]   Fax:  [phone]  Name: [name] MRN: [id number] Date of Birth: [birth year]

## 2017-11-07 ENCOUNTER — Other Ambulatory Visit: Payer: Self-pay

## 2017-11-07 ENCOUNTER — Ambulatory Visit (HOSPITAL_COMMUNITY): Payer: BLUE CROSS/BLUE SHIELD

## 2017-11-07 ENCOUNTER — Encounter (HOSPITAL_COMMUNITY): Payer: Self-pay

## 2017-11-07 DIAGNOSIS — M25511 Pain in right shoulder: Secondary | ICD-10-CM

## 2017-11-07 DIAGNOSIS — M542 Cervicalgia: Secondary | ICD-10-CM

## 2017-11-07 DIAGNOSIS — R29898 Other symptoms and signs involving the musculoskeletal system: Secondary | ICD-10-CM

## 2017-11-07 DIAGNOSIS — M62830 Muscle spasm of back: Secondary | ICD-10-CM

## 2017-11-07 NOTE — Patient Instructions (Addendum)
  Sternocleidomastoid (SCM) Stretch In sitting, place one hand over the front of your collarbone, then extend your head backwards and to each side.  Repeat 3 Times Hold 20 Seconds Complete 3 Sets Perform 1 Time(s) a Day   RETRACTION / CHIN TUCK Slowly draw your head back so that your ears line up with your shoulders.  Repeat 10 Times Hold 5 Seconds Complete 1 Set Perform 1 Time(s) a Day    1-5 Decompression Exercises

## 2017-11-07 NOTE — Therapy (Signed)
[place], [place], [address] Phone: [phone]   Fax:  [phone]  Physical Therapy Treatment/Re-assessment  Patient Details  Name: [name] MRN: [id number] Date of Birth: [birth year] Referring Provider: Garald Balding, MD   Encounter Date: 11/07/2017  PT End of Session - 11/07/17 0832    Visit Number  7    Number of Visits  13    Date for PT Re-Evaluation  11/28/17    Authorization Type  [employer] [place]; 11/07/17 reassessment     Authorization Time Period  10/17/2017 - 11/28/2017    Authorization - Visit Number  1    Authorization - Number of Visits  10    PT Start Time  0819    PT Stop Time  0908    PT Time Calculation (min)  49 min    Activity Tolerance  Patient tolerated treatment well;No increased pain    Behavior During Therapy  WFL for tasks assessed/performed       Past Medical History:  Diagnosis Date  . Bilateral kidney stones 08/30/2017  . Depression   . Diabetes mellitus without complication ([place])   . Fatigue   . Fibroids, intramural 06/12/2017   Has subserosal and submucosal and intramural fibroids   . Hyperlipidemia   . Hypertension   . Snoring   . Thickened endometrium 06/12/2017   Will get endo bx    Past Surgical History:  Procedure Laterality Date  . CESAREAN SECTION     2  . FOOT SURGERY      There were no vitals filed for this visit.  Subjective Assessment - 11/07/17 0821    Subjective  Patient arrives reporting there are statements in her evaluation that are not correct and that she would like to review them. She was unabel to specificy what section the discrepencies were in. She also reports he low back is really bothering and that she wants to have some therapy for that. She states she told her doctor this and that she thought she would get treated for both.    Limitations  Sitting;Reading;Lifting;Standing;Walking;House hold activities    Diagnostic tests   x-ray/MRI - clearing her cervical spine and thoracic spine, no abnormal findings or acute injuries    Currently in Pain?  Yes    Pain Score  4     Pain Location  Neck    Pain Descriptors / Indicators  Aching    Pain Type  Chronic pain    Pain Onset  More than a month ago    Pain Frequency  Intermittent    Multiple Pain Sites  Yes    Pain Score  6    Pain Location  Back    Pain Orientation  Right;Lower    Pain Descriptors / Indicators  Aching    Pain Type  Chronic pain    Pain Onset  More than a month ago    Pain Frequency  Intermittent         [facility] PT Assessment - 11/07/17 0001      Assessment   Medical Diagnosis  Cervicalgia, Whiplash    Referring Provider  Garald Balding, MD    Onset Date/Surgical Date  08/20/17    Hand Dominance  Right    Next MD Visit  10/18/17      Cognition   Overall Cognitive Status  Within Functional Limits for tasks assessed      AROM   Overall AROM Comments  Rtt/Lt rotatoin bother left shoulder/neck    Cervical Flexion  48 was 45    Cervical Extension  31 was 30    Cervical - Right Side Bend  40 was 24    Cervical - Left Side Bend  36 was 22    Cervical - Right Rotation  70 was 60    Cervical - Left Rotation  60 was 52      Strength   Overall Strength Comments  pain with Rt shuolder pain; Cervical flexor endurance test: 6 seconds    Right Shoulder Flexion  4/5 was 4+    Right Shoulder ABduction  4/5 was 4    Left Shoulder Flexion  4+/5    Left Shoulder ABduction  4+/5 was 4+    Right Elbow Flexion  4+/5 was 4+    Right Elbow Extension  4+/5 was 4    Left Elbow Flexion  4+/5 was 4+    Left Elbow Extension  4+/5 was 4    Right Wrist Flexion  4/5 was 4    Right Wrist Extension  4/5 was 4    Left Wrist Flexion  4/5 was 4    Left Wrist Extension  4/5 was 4        [facility] Adult PT Treatment/Exercise - 11/07/17 0001      Neck Exercises: Seated   Neck Retraction  3 secs;Limitations;15 reps    Neck Retraction Limitations  2 sets;  Verbal and tactile cues for form    Other Seated Exercise  SCM stretch, 3x 30 seconds Bil, verbal/tactile cues for proper form      Neck Exercises: Supine   Other Supine Exercise  decompression exercises 1-5 level 1 for cervical and low back pain, 10 ties each exercise, 3-5 second holds: relax on back, shuolder press, head press, leg lengthener, leg press       PT Education - 11/07/17 6144    Education provided  Yes    Education Details  Educated on overall progress in therapy and importance of participating in HEP to make progress with pain and motion. Asked patient to bring her note from PT evaluation in to next appointment as she reports there are statements that seem inaccurate to her but when reveiweing ntoe durign today's session she could not find what was inaccurate. Extensive time spent today discussign HEP review and that the focus on her re-assessment is for her neck pain which is waht she was initially referred for. Educated that we can address some of her low back pain as well as her entire spine works together and it needs to function well as a unit for her posture and to improve mobility.    Person(s) Educated  Patient    Methods  Explanation    Comprehension  Verbalized understanding       PT Short Term Goals - 11/07/17 0831      PT SHORT TERM GOAL #1   Title  Patient will be independent with HEP to improve functional ROM for cervical spine and postural muscel endurance ot reduce pain and improve QOL.    Baseline  11/07/17 - patient unable to perform correctly    Time  3    Period  Weeks    Status  On-going      PT SHORT TERM GOAL #2   Title  Patient will improvecervical ROM by 8 degrees in all limited directions to indicate significant improvement for greater ease of performance iwth ADL's such as dressing, bathing  cooking, and driving.    Baseline  11/07/17 - improved all motions except flexion/extension    Time  3    Period  Weeks    Status  Partially Met      PT  SHORT TERM GOAL #3   Title  Patient will improve cervical flexor endurance test by 10 seconds to demosntrate improved cervical activation to improve posture throughout daily acitivties and demonstrate increased functional muscle endurance.    Baseline  11/07/17 - no change    Time  3    Period  Weeks    Status  On-going        PT Long Term Goals - 11/07/17 0831      PT LONG TERM GOAL #1   Title  Patient will improvecervical ROM by 15 degrees in all limited directions to indicate significant improvement for greater ease of performance with ADL's such as dressing, bathing cooking, and driving.    Baseline  11/07/17 - improved by 10 degress or more for most motions except flexion/extension    Time  6    Period  Weeks    Status  Partially Met      PT LONG TERM GOAL #2   Title  Patient will improve cervical flexor endurance test to 30 seconds to demosntrate improved cervical activation to improve posture throughout daily acitivties and demonstrate increased functional muscle endurance.    Baseline  11/07/17 - no change    Time  6    Period  Weeks    Status  On-going      PT LONG TERM GOAL #3   Title  Patient will demonstrate 12 point improvement in NDI to demonstrate recuded disability wth functional activities through patient self report to show improved QOL.    Baseline  11/07/17 - 23/50    Time  6    Period  Weeks    Status  On-going      PT LONG TERM GOAL #4   Title  Patient will report a maximum of 3/10 through the last week and have no pain with AROM testing of cervical spine or with resisted isometrics indicating decreased in irritability of pain for improved QOL.    Time  6    Period  Weeks    Status  On-going        Plan - 11/07/17 0830    Clinical Impression Statement  Re-assessment performed today and patient has made limited progress towards goals. She has demonstrated some ROM improvements but has ongoing limitations in flexion/extension of cervical spine and continues  to be limited by pain in neck and low back. She was unable to perform her HEP properly today and exercises were reviewed. Additional low level exercises were added today to address pain and strengthen postural muscles. She was educated on importance of completing HEP to improve function, reduce pain, and progress towards goals. She will continue to benefit from skilled PT services at this time to address pain and ongoing impairments to improve mobility and QOL.     Rehab Potential  Fair    Clinical Impairments Affecting Rehab Potential  mild kinesiophobia improving    PT Frequency  2x / week    PT Duration  6 weeks    PT Treatment/Interventions  ADLs/Self Care Home Management;Electrical Stimulation;Traction;Therapeutic exercise;Therapeutic activities;Functional mobility training;Neuromuscular re-education;Patient/family education;Manual techniques;Passive range of motion;Dry needling;Taping    PT Next Visit Plan  cotninue with postural education and strengthening, STM as needed for pain management.  Progress to  prone exercises.    PT Home Exercise Plan  10/22/17: Seated chin tucks 10 x for 5 seconds 1x/day; Seated SCM stretch bilaterally 3x20 seconds 1x/day    Consulted and Agree with Plan of Care  Patient       Patient will benefit from skilled therapeutic intervention in order to improve the following deficits and impairments:  Decreased endurance, Decreased mobility, Hypomobility, Increased muscle spasms, Improper body mechanics, Decreased range of motion, Decreased activity tolerance, Decreased strength, Increased fascial restricitons, Impaired flexibility, Pain, Postural dysfunction  Visit Diagnosis: Cervicalgia  Muscle spasm of back  Right shoulder pain, unspecified chronicity  Other symptoms and signs involving the musculoskeletal system     Problem List Patient Active Problem List   Diagnosis Date Noted  . Bilateral kidney stones 08/30/2017  . Fibroids, intramural 06/12/2017  .  Thickened endometrium 06/12/2017  . Type 2 diabetes mellitus without complication, without long-term current use of insulin ([place]) 03/27/2017  . Essential hypertension 03/27/2017  . Hyperlipidemia LDL goal <100 03/27/2017  . OBESITY, NOS 09/26/2006    Kipp Brood, PT, DPT Physical Therapist with [facility]  11/07/2017 12:33 PM    [facility] [address] [place], [place], [address] Phone: [phone]   Fax:  [phone]  Name: [name] MRN: [id number] Date of Birth: November 07, 1955

## 2017-11-12 ENCOUNTER — Ambulatory Visit (HOSPITAL_COMMUNITY): Payer: BLUE CROSS/BLUE SHIELD

## 2017-11-12 ENCOUNTER — Encounter (HOSPITAL_COMMUNITY): Payer: Self-pay

## 2017-11-12 DIAGNOSIS — M542 Cervicalgia: Secondary | ICD-10-CM

## 2017-11-12 DIAGNOSIS — R29898 Other symptoms and signs involving the musculoskeletal system: Secondary | ICD-10-CM

## 2017-11-12 DIAGNOSIS — M62830 Muscle spasm of back: Secondary | ICD-10-CM

## 2017-11-12 DIAGNOSIS — M25511 Pain in right shoulder: Secondary | ICD-10-CM

## 2017-11-12 NOTE — Therapy (Signed)
[facility], [place], [address] Phone: [phone]   Fax:  [phone]  Physical Therapy Treatment  Patient Details  Name: [name] MRN: [id number] Date of Birth: 10/20/1955 Referring Provider: Garald Balding, MD   Encounter Date: 11/12/2017  PT End of Session - 11/12/17 0835    Visit Number  8    Number of Visits  13    Date for PT Re-Evaluation  11/28/17 minireassess completed 11/07/17    Authorization Type  [employer]; 11/07/17 reassessment     Authorization Time Period  10/17/2017 - 11/28/2017    PT Start Time  0819    PT Stop Time  0858    PT Time Calculation (min)  39 min    Activity Tolerance  Patient tolerated treatment well;No increased pain    Behavior During Therapy  WFL for tasks assessed/performed       Past Medical History:  Diagnosis Date  . Bilateral kidney stones 08/30/2017  . Depression   . Diabetes mellitus without complication ([place])   . Fatigue   . Fibroids, intramural 06/12/2017   Has subserosal and submucosal and intramural fibroids   . Hyperlipidemia   . Hypertension   . Snoring   . Thickened endometrium 06/12/2017   Will get endo bx    Past Surgical History:  Procedure Laterality Date  . CESAREAN SECTION     2  . FOOT SURGERY      There were no vitals filed for this visit.  Subjective Assessment - 11/12/17 0821    Subjective  Pt stated she feels improvements but continues to have soreness in neck with cerical movement to the rigth, current pain scale 4/10    Patient Stated Goals  have less pain    Currently in Pain?  Yes    Pain Score  4     Pain Location  Neck    Pain Orientation  Right    Pain Descriptors / Indicators  Sore;Aching    Pain Type  Chronic pain    Pain Onset  More than a month ago    Pain Frequency  Intermittent    Aggravating Factors   movement    Pain Relieving Factors  being still                       [facility]  Adult PT Treatment/Exercise - 11/12/17 0001      Neck Exercises: Theraband   Scapula Retraction  Red;15 reps max verbal and tactile cueing    Shoulder Extension  Red;15 reps    Rows  Red;15 reps      Neck Exercises: Standing   Other Standing Exercises  wall arches 10 reps (resume next session)    Other Standing Exercises  UE flexion against wall 10 reps (resume next session)      Neck Exercises: Seated   Neck Retraction  3 secs;Limitations;10 reps cueing for proper mechanics    Neck Retraction Limitations  2 sets; Verbal and tactile cues for form    X to V  15 reps    W Back  20 reps 3" holds      Neck Exercises: Prone   Rows  10 reps c/o nausea, light headed, D/C all prone ex      Manual Therapy   Manual Therapy  Soft tissue mobilization    Manual therapy comments  completed seperately from all other skilled interventions    Soft  tissue mobilization  seated, arm supported d/t  vertigo    Myofascial Release  Rt upper trap      Neck Exercises: Stretches   Upper Trapezius Stretch  Right;Left;2 reps;30 seconds    Upper Trapezius Stretch Limitations  Seated               PT Short Term Goals - 11/07/17 0831      PT SHORT TERM GOAL #1   Title  Patient will be independent with HEP to improve functional ROM for cervical spine and postural muscel endurance ot reduce pain and improve QOL.    Baseline  11/07/17 - patient unable to perform correctly    Time  3    Period  Weeks    Status  On-going      PT SHORT TERM GOAL #2   Title  Patient will improvecervical ROM by 8 degrees in all limited directions to indicate significant improvement for greater ease of performance iwth ADL's such as dressing, bathing cooking, and driving.    Baseline  11/07/17 - improved all motions except flexion/extension    Time  3    Period  Weeks    Status  Partially Met      PT SHORT TERM GOAL #3   Title  Patient will improve cervical flexor endurance test by 10 seconds to demosntrate improved  cervical activation to improve posture throughout daily acitivties and demonstrate increased functional muscle endurance.    Baseline  11/07/17 - no change    Time  3    Period  Weeks    Status  On-going        PT Long Term Goals - 11/07/17 0831      PT LONG TERM GOAL #1   Title  Patient will improvecervical ROM by 15 degrees in all limited directions to indicate significant improvement for greater ease of performance with ADL's such as dressing, bathing cooking, and driving.    Baseline  11/07/17 - improved by 10 degress or more for most motions except flexion/extension    Time  6    Period  Weeks    Status  Partially Met      PT LONG TERM GOAL #2   Title  Patient will improve cervical flexor endurance test to 30 seconds to demosntrate improved cervical activation to improve posture throughout daily acitivties and demonstrate increased functional muscle endurance.    Baseline  11/07/17 - no change    Time  6    Period  Weeks    Status  On-going      PT LONG TERM GOAL #3   Title  Patient will demonstrate 12 point improvement in NDI to demonstrate recuded disability wth functional activities through patient self report to show improved QOL.    Baseline  11/07/17 - 23/50    Time  6    Period  Weeks    Status  On-going      PT LONG TERM GOAL #4   Title  Patient will report a maximum of 3/10 through the last week and have no pain with AROM testing of cervical spine or with resisted isometrics indicating decreased in irritability of pain for improved QOL.    Time  6    Period  Weeks    Status  On-going            Plan - 11/12/17 0908    Clinical Impression Statement  Session focus on postural strengthening and manual techniques for pain control.  Pt required tactile and verbal cueing to improve form with all exercises.  Progressed to prone exercises for postural strengthening, pt with c/o nausea and light headed with position, DC exercises in prone due to s/s.  EOS with manual  soft tissue mobilizaiton to address tightness and overall tension of Rt cervical musculature.  No reports of increased pain through session.      Rehab Potential  Fair    Clinical Impairments Affecting Rehab Potential  mild kinesiophobia improving    PT Frequency  2x / week    PT Duration  6 weeks    PT Treatment/Interventions  ADLs/Self Care Home Management;Electrical Stimulation;Traction;Therapeutic exercise;Therapeutic activities;Functional mobility training;Neuromuscular re-education;Patient/family education;Manual techniques;Passive range of motion;Dry needling;Taping    PT Next Visit Plan  cotninue with postural education and strengthening, STM as needed for pain management.  Begin standing scapular strengthening exercises including wall push ups next session, DC all prone exercises due to nausea/lightheadedness in position.      PT Home Exercise Plan  10/22/17: Seated chin tucks 10 x for 5 seconds 1x/day; Seated SCM stretch bilaterally 3x20 seconds 1x/day       Patient will benefit from skilled therapeutic intervention in order to improve the following deficits and impairments:  Decreased endurance, Decreased mobility, Hypomobility, Increased muscle spasms, Improper body mechanics, Decreased range of motion, Decreased activity tolerance, Decreased strength, Increased fascial restricitons, Impaired flexibility, Pain, Postural dysfunction  Visit Diagnosis: Cervicalgia  Muscle spasm of back  Right shoulder pain, unspecified chronicity  Other symptoms and signs involving the musculoskeletal system     Problem List Patient Active Problem List   Diagnosis Date Noted  . Bilateral kidney stones 08/30/2017  . Fibroids, intramural 06/12/2017  . Thickened endometrium 06/12/2017  . Type 2 diabetes mellitus without complication, without long-term current use of insulin ([place]) 03/27/2017  . Essential hypertension 03/27/2017  . Hyperlipidemia LDL goal <100 03/27/2017  . OBESITY, NOS  09/26/2006   Ihor Austin, LPTA; [place]  Aldona Lento 11/12/2017, 10:43 AM  [place] [address] [place], [place], [address] Phone: [phone]   Fax:  [phone]  Name: [name] MRN: [id number] Date of Birth: [birth year]

## 2017-11-13 ENCOUNTER — Ambulatory Visit (INDEPENDENT_AMBULATORY_CARE_PROVIDER_SITE_OTHER): Payer: BLUE CROSS/BLUE SHIELD | Admitting: Orthopaedic Surgery

## 2017-11-13 ENCOUNTER — Encounter (INDEPENDENT_AMBULATORY_CARE_PROVIDER_SITE_OTHER): Payer: Self-pay | Admitting: Orthopaedic Surgery

## 2017-11-13 VITALS — BP 153/88 | HR 73

## 2017-11-13 DIAGNOSIS — M542 Cervicalgia: Secondary | ICD-10-CM
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
## 2017-11-14 ENCOUNTER — Ambulatory Visit (HOSPITAL_COMMUNITY): Payer: BLUE CROSS/BLUE SHIELD

## 2017-11-14 ENCOUNTER — Encounter (HOSPITAL_COMMUNITY): Payer: Self-pay

## 2017-11-14 VITALS — BP 154/84 | HR 72

## 2017-11-14 DIAGNOSIS — M542 Cervicalgia: Secondary | ICD-10-CM | POA: Diagnosis not present

## 2017-11-14 DIAGNOSIS — R29898 Other symptoms and signs involving the musculoskeletal system: Secondary | ICD-10-CM

## 2017-11-14 DIAGNOSIS — M25511 Pain in right shoulder: Secondary | ICD-10-CM

## 2017-11-14 DIAGNOSIS — M62830 Muscle spasm of back: Secondary | ICD-10-CM

## 2017-11-14 NOTE — Therapy (Signed)
[place], [place], [address] Phone: [phone]   Fax:  [phone]  Physical Therapy Treatment  Patient Details  Name: [name] MRN: [id number] Date of Birth: [birth year] Referring Provider: Garald Balding, MD   Encounter Date: 11/14/2017  PT End of Session - 11/14/17 0914    Visit Number  9    Number of Visits  13    Date for PT Re-Evaluation  11/28/17    Authorization Time Period  10/17/2017 - 11/28/2017    PT Start Time  0817    PT Stop Time  0858    PT Time Calculation (min)  41 min       Past Medical History:  Diagnosis Date  . Bilateral kidney stones 08/30/2017  . Depression   . Diabetes mellitus without complication ([place])   . Fatigue   . Fibroids, intramural 06/12/2017   Has subserosal and submucosal and intramural fibroids   . Hyperlipidemia   . Hypertension   . Snoring   . Thickened endometrium 06/12/2017   Will get endo bx    Past Surgical History:  Procedure Laterality Date  . CESAREAN SECTION     2  . FOOT SURGERY      Vitals:   11/14/17 0837  BP: (!) 154/84  Pulse: 72    Subjective Assessment - 11/14/17 0829    Subjective  Pt stated she had a rough night, woke up with nausea this morning.  Current pain scale 5/10 Rt shoulder down to elbow only with movement.    Patient Stated Goals  have less pain    Currently in Pain?  Yes    Pain Score  5     Pain Location  Shoulder    Pain Orientation  Right    Pain Descriptors / Indicators  Aching;Sore    Pain Type  Chronic pain    Pain Onset  More than a month ago    Pain Frequency  Intermittent    Aggravating Factors   movement    Pain Relieving Factors  being still            [facility] Adult PT Treatment/Exercise - 11/14/17 0001      Exercises   Exercises  Neck      Neck Exercises: Theraband   Shoulder Extension  10 reps;Red    Rows  10 reps;Red      Neck Exercises: Standing   Other Standing Exercises  wall  arches 10 reps     Other Standing Exercises  UE flexion against wall 10 reps       Neck Exercises: Seated   Neck Retraction  3 secs;Limitations;10 reps    Neck Retraction Limitations  2 sets; Verbal and tactile cues for form    W Back  15 reps    Money  10 reps    Other Seated Exercise  3D cervical excurstion x 5 gentle head movements      Manual Therapy   Manual Therapy  Soft tissue mobilization    Manual therapy comments  completed seperately from all other skilled interventions    Soft tissue mobilization  seated, arm supported d/t  vertigo    Myofascial Release  Rt upper trap               PT Short Term Goals - 11/07/17 0831      PT SHORT TERM GOAL #1   Title  Patient will be independent with HEP to improve  functional ROM for cervical spine and postural muscel endurance ot reduce pain and improve QOL.    Baseline  11/07/17 - patient unable to perform correctly    Time  3    Period  Weeks    Status  On-going      PT SHORT TERM GOAL #2   Title  Patient will improvecervical ROM by 8 degrees in all limited directions to indicate significant improvement for greater ease of performance iwth ADL's such as dressing, bathing cooking, and driving.    Baseline  11/07/17 - improved all motions except flexion/extension    Time  3    Period  Weeks    Status  Partially Met      PT SHORT TERM GOAL #3   Title  Patient will improve cervical flexor endurance test by 10 seconds to demosntrate improved cervical activation to improve posture throughout daily acitivties and demonstrate increased functional muscle endurance.    Baseline  11/07/17 - no change    Time  3    Period  Weeks    Status  On-going        PT Long Term Goals - 11/07/17 0831      PT LONG TERM GOAL #1   Title  Patient will improvecervical ROM by 15 degrees in all limited directions to indicate significant improvement for greater ease of performance with ADL's such as dressing, bathing cooking, and driving.     Baseline  11/07/17 - improved by 10 degress or more for most motions except flexion/extension    Time  6    Period  Weeks    Status  Partially Met      PT LONG TERM GOAL #2   Title  Patient will improve cervical flexor endurance test to 30 seconds to demosntrate improved cervical activation to improve posture throughout daily acitivties and demonstrate increased functional muscle endurance.    Baseline  11/07/17 - no change    Time  6    Period  Weeks    Status  On-going      PT LONG TERM GOAL #3   Title  Patient will demonstrate 12 point improvement in NDI to demonstrate recuded disability wth functional activities through patient self report to show improved QOL.    Baseline  11/07/17 - 23/50    Time  6    Period  Weeks    Status  On-going      PT LONG TERM GOAL #4   Title  Patient will report a maximum of 3/10 through the last week and have no pain with AROM testing of cervical spine or with resisted isometrics indicating decreased in irritability of pain for improved QOL.    Time  6    Period  Weeks    Status  On-going            Plan - 11/14/17 0935    Clinical Impression Statement  Pt wiht rough night last night, increased Rt neck and Rt shoulder pain and reports of nausea this morning.  Vitals assessed with noted high BP levels, no reports of lightheadedness, SOB or dizziness through session.  Pt continues to required moderate cueing to reduce forwards headed posture with any UE movements.  Pt educated on importance of improving awareness of head position with UE movements to improve posture and reduce pain with movements. EOS with manual techniques to address tight soft tissue restrictions for pain control, able to reduce though unable to fully resolve spasm Rt UT.  Rehab Potential  Fair    Clinical Impairments Affecting Rehab Potential  mild kinesiophobia improving    PT Frequency  2x / week    PT Duration  6 weeks    PT Treatment/Interventions  ADLs/Self Care Home  Management;Electrical Stimulation;Traction;Therapeutic exercise;Therapeutic activities;Functional mobility training;Neuromuscular re-education;Patient/family education;Manual techniques;Passive range of motion;Dry needling;Taping    PT Next Visit Plan  cotninue with postural education and strengthening, STM as needed for pain management.  Begin standing scapular strengthening exercises including wall push ups next session, DC all prone exercises due to nausea/lightheadedness in position.      PT Home Exercise Plan  10/22/17: Seated chin tucks 10 x for 5 seconds 1x/day; Seated SCM stretch bilaterally 3x20 seconds 1x/day       Patient will benefit from skilled therapeutic intervention in order to improve the following deficits and impairments:  Decreased endurance, Decreased mobility, Hypomobility, Increased muscle spasms, Improper body mechanics, Decreased range of motion, Decreased activity tolerance, Decreased strength, Increased fascial restricitons, Impaired flexibility, Pain, Postural dysfunction  Visit Diagnosis: Cervicalgia  Muscle spasm of back  Right shoulder pain, unspecified chronicity  Other symptoms and signs involving the musculoskeletal system     Problem List Patient Active Problem List   Diagnosis Date Noted  . Bilateral kidney stones 08/30/2017  . Fibroids, intramural 06/12/2017  . Thickened endometrium 06/12/2017  . Type 2 diabetes mellitus without complication, without long-term current use of insulin ([facility]) 03/27/2017  . Essential hypertension 03/27/2017  . Hyperlipidemia LDL goal <100 03/27/2017  . OBESITY, NOS 09/26/2006   Ihor Austin, LPTA; [place]  Aldona Lento 11/14/2017, 9:55 AM  [place], [place], [address] Phone: [phone]   Fax:  [phone]  Name: [name] MRN: [id number] Date of Birth: 1955/11/23

## 2017-11-18 ENCOUNTER — Encounter: Payer: Self-pay | Admitting: Family Medicine

## 2017-11-18 ENCOUNTER — Ambulatory Visit (INDEPENDENT_AMBULATORY_CARE_PROVIDER_SITE_OTHER): Payer: BLUE CROSS/BLUE SHIELD | Admitting: Family Medicine

## 2017-11-18 ENCOUNTER — Other Ambulatory Visit: Payer: Self-pay

## 2017-11-18 VITALS — BP 130/88 | HR 85 | Temp 98.7°F | Resp 12 | Ht 60.0 in | Wt 195.0 lb

## 2017-11-18 DIAGNOSIS — E785 Hyperlipidemia, unspecified: Secondary | ICD-10-CM

## 2017-11-18 DIAGNOSIS — M542 Cervicalgia: Secondary | ICD-10-CM

## 2017-11-18 DIAGNOSIS — E119 Type 2 diabetes mellitus without complications: Secondary | ICD-10-CM

## 2017-11-18 DIAGNOSIS — I10 Essential (primary) hypertension: Secondary | ICD-10-CM | POA: Diagnosis not present

## 2017-11-18 MED ORDER — OMEPRAZOLE 40 MG PO CPDR: 40.0000 mg | DELAYED_RELEASE_CAPSULE | Freq: Every day | ORAL | 1 refills | Status: AC

## 2017-11-18 NOTE — Patient Instructions (Addendum)
I will refill all of  Your medicines  [facility] up with [facility] Call for an appointment  Also, you need to call Dr. Olevia Perches office to schedule your colonoscopy
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
## 2017-11-19 ENCOUNTER — Encounter (HOSPITAL_COMMUNITY): Payer: Self-pay

## 2017-11-19 ENCOUNTER — Ambulatory Visit (HOSPITAL_COMMUNITY): Payer: BLUE CROSS/BLUE SHIELD

## 2017-11-19 ENCOUNTER — Other Ambulatory Visit: Payer: Self-pay

## 2017-11-19 DIAGNOSIS — M25511 Pain in right shoulder: Secondary | ICD-10-CM

## 2017-11-19 DIAGNOSIS — R29898 Other symptoms and signs involving the musculoskeletal system: Secondary | ICD-10-CM

## 2017-11-19 DIAGNOSIS — M62830 Muscle spasm of back: Secondary | ICD-10-CM

## 2017-11-19 DIAGNOSIS — M542 Cervicalgia: Secondary | ICD-10-CM

## 2017-11-19 NOTE — Therapy (Signed)
[facility], [place], [address] Phone: [phone]   Fax:  [phone]  Physical Therapy Treatment  Patient Details  Name: [name] MRN: [id number] Date of Birth: 03/02/1956 Referring Provider: Garald Balding, MD   Encounter Date: 11/19/2017  PT End of Session - 11/19/17 0827    Visit Number  10    Number of Visits  13    Date for PT Re-Evaluation  11/28/17    Authorization Time Period  10/17/2017 - 11/28/2017    Authorization - Visit Number  4    Authorization - Number of Visits  10    PT Start Time  0816    PT Stop Time  0900    PT Time Calculation (min)  44 min    Activity Tolerance  Patient tolerated treatment well;No increased pain    Behavior During Therapy  WFL for tasks assessed/performed       Past Medical History:  Diagnosis Date  . Bilateral kidney stones 08/30/2017  . Depression   . Diabetes mellitus without complication ([employer])   . Fatigue   . Fibroids, intramural 06/12/2017   Has subserosal and submucosal and intramural fibroids   . Hyperlipidemia   . Hypertension   . Snoring   . Thickened endometrium 06/12/2017   Will get endo bx    Past Surgical History:  Procedure Laterality Date  . CESAREAN SECTION     2  . FOOT SURGERY      There were no vitals filed for this visit.  Subjective Assessment - 11/19/17 0820    Subjective  Patient reports she has not been doing her exercises recently and did not do her exercises this weekend. She reports she woke up with a slight headache and mild nausea this AM. She states she had a visit with her PCP yesterday and that she is going to have to transition to a new doctor in the next few weeks. She reports she is still frustrated with her pain and wants to know when it will get better.    Limitations  Sitting;Reading;Lifting;Standing;Walking;House hold activities    Diagnostic tests  x-ray/MRI - clearing her cervical spine and thoracic spine,  no abnormal findings or acute injuries    Patient Stated Goals  have less pain    Currently in Pain?  Yes    Pain Score  5     Pain Location  Neck    Pain Orientation  Right    Pain Descriptors / Indicators  Sharp;Sore    Pain Type  Chronic pain    Pain Radiating Towards  radiates into shuolder and arm    Pain Onset  More than a month ago    Pain Frequency  Intermittent       [facility] Adult PT Treatment/Exercise - 11/19/17 0001      Neck Exercises: Machines for Strengthening   UBE (Upper Arm Bike)  3 minutes backward level 1      Neck Exercises: Theraband   Shoulder Extension  15 reps;Red    Rows  15 reps;Red      Neck Exercises: Standing   Wall Push Ups  15 reps;Limitations    Wall Push Ups Limitations  2 sets    Other Standing Exercises  UE flexion against wall 2x 15 reps       Neck Exercises: Seated   Neck Retraction  3 secs;20 reps    Neck Retraction Limitations  2 sets; Verbal and tactile cues  for form    Money  15 reps;3 secs;Limitations    Money Limitations  red TB and 2 sets      Manual Therapy   Manual Therapy  Soft tissue mobilization    Manual therapy comments  completed seperately from all other skilled interventions    Soft tissue mobilization  Setaed, parallel mobilization to Rt upper trap to reduce myofascial restrictions, alleviated pain, and improve mobility    Myofascial Release  Rt upper trap         PT Education - 11/19/17 0829    Education provided  Yes    Education Details  Continued to educate patient on importance of participaing in HEP as she reports she has not been compliant with it.  Educated on neuroscience pain concept for chronic bakc/neck pain and discussed how movement is beneficial.     Person(s) Educated  Patient    Methods  Explanation    Comprehension  Verbalized understanding;Need further instruction       PT Short Term Goals - 11/07/17 0831      PT SHORT TERM GOAL #1   Title  Patient will be independent with HEP to improve  functional ROM for cervical spine and postural muscel endurance ot reduce pain and improve QOL.    Baseline  11/07/17 - patient unable to perform correctly    Time  3    Period  Weeks    Status  On-going      PT SHORT TERM GOAL #2   Title  Patient will improvecervical ROM by 8 degrees in all limited directions to indicate significant improvement for greater ease of performance iwth ADL's such as dressing, bathing cooking, and driving.    Baseline  11/07/17 - improved all motions except flexion/extension    Time  3    Period  Weeks    Status  Partially Met      PT SHORT TERM GOAL #3   Title  Patient will improve cervical flexor endurance test by 10 seconds to demosntrate improved cervical activation to improve posture throughout daily acitivties and demonstrate increased functional muscle endurance.    Baseline  11/07/17 - no change    Time  3    Period  Weeks    Status  On-going        PT Long Term Goals - 11/07/17 0831      PT LONG TERM GOAL #1   Title  Patient will improvecervical ROM by 15 degrees in all limited directions to indicate significant improvement for greater ease of performance with ADL's such as dressing, bathing cooking, and driving.    Baseline  11/07/17 - improved by 10 degress or more for most motions except flexion/extension    Time  6    Period  Weeks    Status  Partially Met      PT LONG TERM GOAL #2   Title  Patient will improve cervical flexor endurance test to 30 seconds to demosntrate improved cervical activation to improve posture throughout daily acitivties and demonstrate increased functional muscle endurance.    Baseline  11/07/17 - no change    Time  6    Period  Weeks    Status  On-going      PT LONG TERM GOAL #3   Title  Patient will demonstrate 12 point improvement in NDI to demonstrate recuded disability wth functional activities through patient self report to show improved QOL.    Baseline  11/07/17 - 23/50  Time  6    Period  Weeks     Status  On-going      PT LONG TERM GOAL #4   Title  Patient will report a maximum of 3/10 through the last week and have no pain with AROM testing of cervical spine or with resisted isometrics indicating decreased in irritability of pain for improved QOL.    Time  6    Period  Weeks    Status  On-going         Plan - 11/19/17 1517    Clinical Impression Statement  Continued to focus on postural strengthening and manual techniques for pain control this session. Patient continues to require verbal and visual cues to achieve proper form with exercises. She reports her pain was radiating down her right arm more this past weekend and she is unsure of what aggravated it. I educated patient on chronic pain interpretation in the brain and discussed benefits of movement to gradually retrain pain interpretation centers and reduce pain over time. She will need further education on pain neuroscience concepts. At EOS with manual soft tissue mobilization to address tightness and overall tension of Rt cervical muscles/upper trap. Patient also requires ongoing education of importance of HEP participation. She will continue to benefit from skilled PT interventions to progress towards goals.    Rehab Potential  Fair    Clinical Impairments Affecting Rehab Potential  mild kinesiophobia improving    PT Frequency  2x / week    PT Duration  6 weeks    PT Treatment/Interventions  ADLs/Self Care Home Management;Electrical Stimulation;Traction;Therapeutic exercise;Therapeutic activities;Functional mobility training;Neuromuscular re-education;Patient/family education;Manual techniques;Passive range of motion;Dry needling;Taping    PT Next Visit Plan  Adde wall push up to HEP next session. Initiate theraputty exercise for Rt hand. Cotninue with postural education and strengthening, STM as needed for pain management.  Continue with standing scapular strengthening exercises including wall push-ups next session. DC all prone  exercises due to nausea/lightheadedness in position.      PT Home Exercise Plan  10/22/17: Seated chin tucks 10 x for 5 seconds 1x/day; Seated SCM stretch bilaterally 3x20 seconds 1x/day    Consulted and Agree with Plan of Care  Patient       Patient will benefit from skilled therapeutic intervention in order to improve the following deficits and impairments:  Decreased endurance, Decreased mobility, Hypomobility, Increased muscle spasms, Improper body mechanics, Decreased range of motion, Decreased activity tolerance, Decreased strength, Increased fascial restricitons, Impaired flexibility, Pain, Postural dysfunction  Visit Diagnosis: Cervicalgia  Muscle spasm of back  Right shoulder pain, unspecified chronicity  Other symptoms and signs involving the musculoskeletal system     Problem List Patient Active Problem List   Diagnosis Date Noted  . Bilateral kidney stones 08/30/2017  . Fibroids, intramural 06/12/2017  . Thickened endometrium 06/12/2017  . Type 2 diabetes mellitus without complication, without long-term current use of insulin ([place]) 03/27/2017  . Essential hypertension 03/27/2017  . Hyperlipidemia LDL goal <100 03/27/2017  . OBESITY, NOS 09/26/2006    Kipp Brood, PT, DPT Physical Therapist with [facility]  11/19/2017 9:23 AM    [facility] [address] [place], [place], [address] Phone: [phone]   Fax:  [phone]  Name: [name] MRN: [id number] Date of Birth: [birth year]

## 2017-11-21 ENCOUNTER — Encounter (HOSPITAL_COMMUNITY): Payer: Self-pay

## 2017-11-21 ENCOUNTER — Ambulatory Visit (HOSPITAL_COMMUNITY): Payer: BLUE CROSS/BLUE SHIELD

## 2017-11-21 DIAGNOSIS — M542 Cervicalgia: Secondary | ICD-10-CM | POA: Diagnosis not present

## 2017-11-21 DIAGNOSIS — R29898 Other symptoms and signs involving the musculoskeletal system: Secondary | ICD-10-CM

## 2017-11-21 DIAGNOSIS — M25511 Pain in right shoulder: Secondary | ICD-10-CM

## 2017-11-21 DIAGNOSIS — M62830 Muscle spasm of back: Secondary | ICD-10-CM
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
## 2017-11-21 NOTE — Patient Instructions (Signed)
Wall Push-Up    With feet and hands shoulder-width apart, lean into wall, then push away from wall. Repeat 20 times.  Do 1-2 sessions per day.  http://cc.exer.us/57   Copyright  VHI. All rights reserved.

## 2017-11-26 ENCOUNTER — Ambulatory Visit (HOSPITAL_COMMUNITY): Payer: BLUE CROSS/BLUE SHIELD

## 2017-11-26 ENCOUNTER — Encounter (HOSPITAL_COMMUNITY): Payer: Self-pay

## 2017-11-26 ENCOUNTER — Other Ambulatory Visit: Payer: Self-pay

## 2017-11-26 DIAGNOSIS — M25511 Pain in right shoulder: Secondary | ICD-10-CM

## 2017-11-26 DIAGNOSIS — R29898 Other symptoms and signs involving the musculoskeletal system: Secondary | ICD-10-CM

## 2017-11-26 DIAGNOSIS — M62830 Muscle spasm of back: Secondary | ICD-10-CM

## 2017-11-26 DIAGNOSIS — M542 Cervicalgia: Secondary | ICD-10-CM

## 2017-11-26 NOTE — Therapy (Signed)
[place], [place], [address] Phone: [phone]   Fax:  [phone]  Physical Therapy Treatment  Patient Details  Name: [name] MRN: [id number] Date of Birth: Nov 15, 1955 Referring Provider: Garald Balding, MD   Encounter Date: 11/26/2017  PT End of Session - 11/26/17 1108    Visit Number  12    Number of Visits  13    Date for PT Re-Evaluation  11/28/17 Reassessment complete 11/07/17    Authorization Type  [employer]; 11/07/17 reassessment     Authorization Time Period  10/17/2017 - 11/28/2017    Authorization - Visit Number  6    Authorization - Number of Visits  10    PT Start Time  0818    PT Stop Time  0901    PT Time Calculation (min)  43 min    Activity Tolerance  Patient tolerated treatment well;No increased pain    Behavior During Therapy  WFL for tasks assessed/performed       Past Medical History:  Diagnosis Date  . Bilateral kidney stones 08/30/2017  . Depression   . Diabetes mellitus without complication ([place])   . Fatigue   . Fibroids, intramural 06/12/2017   Has subserosal and submucosal and intramural fibroids   . Hyperlipidemia   . Hypertension   . Snoring   . Thickened endometrium 06/12/2017   Will get endo bx    Past Surgical History:  Procedure Laterality Date  . CESAREAN SECTION     2  . FOOT SURGERY      There were no vitals filed for this visit.  Subjective Assessment - 11/26/17 0856    Subjective  Patient reports she woke up with a headache this morning but reports he neck is doing better. She states her Rt arm and hand still hurt occasionally. She states she is doing her HEP "somewhat" and then states "sometimes and some of them, I try every day".    Limitations  Sitting;Reading;Lifting;Standing;Walking;House hold activities    Diagnostic tests  x-ray/MRI - clearing her cervical spine and thoracic spine, no abnormal findings or acute injuries     Patient Stated Goals  have less pain    Currently in Pain?  Yes    Pain Score  4     Pain Location  Head headache    Pain Orientation  Right    Pain Descriptors / Indicators  Aching;Sore    Pain Type  Chronic pain    Pain Radiating Towards  radiated into shoulder and arm    Pain Onset  More than a month ago    Pain Frequency  Intermittent       [facility] Adult PT Treatment/Exercise - 11/26/17 0001      Neck Exercises: Theraband   Shoulder Extension  15 reps;Red;Limitations    Shoulder Extension Limitations  2 sets    Rows  15 reps;Red;Limitations    Rows Limitations  2 sets      Neck Exercises: Standing   Wall Push Ups  20 reps    Wall Push Ups Limitations  2 sets    Other Standing Exercises  UE flexion against wall 1x 15 reps       Neck Exercises: Seated   Neck Retraction  3 secs;20 reps    Neck Retraction Limitations  2 sets; Verbal and tactile cues for form and visual cues tiwh mirror      Hand Exercises for Cervical Radiculopathy  Gross Grasp  2x 10 reps with Rt hand and Red theraputty    Other Hand Exercise for Cervical Radiculopathy  Key grip: 2x 10 reps with Rt hand and Red theraputty      Neck Exercises: Stretches   Upper Trapezius Stretch  Right;Left;2 reps;30 seconds    Corner Stretch  Limitations;10 seconds    Corner Stretch Limitations  15 reps        PT Education - 11/26/17 [phone]    Education provided  Yes    Education Details  Educated on correct exercise form and on new hand exercseis with theraputty. Continues to require education on imprortance of HEp participation and requires cues to obtain proper chin tuck form.     Person(s) Educated  Patient    Methods  Explanation;Verbal cues;Tactile cues;Demonstration    Comprehension  Verbalized understanding       PT Short Term Goals - 11/07/17 0831      PT SHORT TERM GOAL #1   Title  Patient will be independent with HEP to improve functional ROM for cervical spine and postural muscel endurance ot reduce pain  and improve QOL.    Baseline  11/07/17 - patient unable to perform correctly    Time  3    Period  Weeks    Status  On-going      PT SHORT TERM GOAL #2   Title  Patient will improvecervical ROM by 8 degrees in all limited directions to indicate significant improvement for greater ease of performance iwth ADL's such as dressing, bathing cooking, and driving.    Baseline  11/07/17 - improved all motions except flexion/extension    Time  3    Period  Weeks    Status  Partially Met      PT SHORT TERM GOAL #3   Title  Patient will improve cervical flexor endurance test by 10 seconds to demosntrate improved cervical activation to improve posture throughout daily acitivties and demonstrate increased functional muscle endurance.    Baseline  11/07/17 - no change    Time  3    Period  Weeks    Status  On-going        PT Long Term Goals - 11/07/17 0831      PT LONG TERM GOAL #1   Title  Patient will improvecervical ROM by 15 degrees in all limited directions to indicate significant improvement for greater ease of performance with ADL's such as dressing, bathing cooking, and driving.    Baseline  11/07/17 - improved by 10 degress or more for most motions except flexion/extension    Time  6    Period  Weeks    Status  Partially Met      PT LONG TERM GOAL #2   Title  Patient will improve cervical flexor endurance test to 30 seconds to demosntrate improved cervical activation to improve posture throughout daily acitivties and demonstrate increased functional muscle endurance.    Baseline  11/07/17 - no change    Time  6    Period  Weeks    Status  On-going      PT LONG TERM GOAL #3   Title  Patient will demonstrate 12 point improvement in NDI to demonstrate recuded disability wth functional activities through patient self report to show improved QOL.    Baseline  11/07/17 - 23/50    Time  6    Period  Weeks    Status  On-going      PT LONG  TERM GOAL #4   Title  Patient will report a  maximum of 3/10 through the last week and have no pain with AROM testing of cervical spine or with resisted isometrics indicating decreased in irritability of pain for improved QOL.    Time  6    Period  Weeks    Status  On-going        Plan - 11/26/17 1109    Clinical Impression Statement  Continued session focus with postural strengthening this session and held manual techniques to determine if pain will remain unchanged indicating decreased reliance on soft tissue mobilization for pain relief. Patient continues to require verbal, tactile and visual cueing to achieve proper form with exercises. She requires cues to slow down during all exercises to maintain good form and has tendency to rush each exercise. Theraputty exercises were added this session as patient reports she has trouble with Rt hand grip. She will benefit from skilled PT to address impairments and progress towards independence with HEP to improve mobility and reduce pain. Re-assess next session.    Rehab Potential  Fair    Clinical Impairments Affecting Rehab Potential  mild kinesiophobia improving    PT Frequency  2x / week    PT Duration  6 weeks    PT Treatment/Interventions  ADLs/Self Care Home Management;Electrical Stimulation;Traction;Therapeutic exercise;Therapeutic activities;Functional mobility training;Neuromuscular re-education;Patient/family education;Manual techniques;Passive range of motion;Dry needling;Taping    PT Next Visit Plan  Re-assess for potential discharge next session. Provide theraputty exercise for Rt hand to HEP. Continue with postural education and strengthening, STM as needed for pain management.  Continue with standing scapular strengthening exercises including wall push-ups next session.     PT Home Exercise Plan  10/22/17: Seated chin tucks 10 x for 5 seconds 1x/day; Seated SCM stretch bilaterally 3x20 seconds 1x/day; 4/25: wall push-ups    Consulted and Agree with Plan of Care  Patient        Patient will benefit from skilled therapeutic intervention in order to improve the following deficits and impairments:  Decreased endurance, Decreased mobility, Hypomobility, Increased muscle spasms, Improper body mechanics, Decreased range of motion, Decreased activity tolerance, Decreased strength, Increased fascial restricitons, Impaired flexibility, Pain, Postural dysfunction  Visit Diagnosis: Cervicalgia  Muscle spasm of back  Right shoulder pain, unspecified chronicity  Other symptoms and signs involving the musculoskeletal system     Problem List Patient Active Problem List   Diagnosis Date Noted  . Bilateral kidney stones 08/30/2017  . Fibroids, intramural 06/12/2017  . Thickened endometrium 06/12/2017  . Type 2 diabetes mellitus without complication, without long-term current use of insulin ([place]) 03/27/2017  . Essential hypertension 03/27/2017  . Hyperlipidemia LDL goal <100 03/27/2017  . OBESITY, NOS 09/26/2006    Kipp Brood, PT, DPT Physical Therapist with [facility]  11/26/2017 11:15 AM    [place] [facility] [address] [place], [place], [address] Phone: [phone]   Fax:  [phone]  Name: Lainie Daubert MRN: [id number] Date of Birth: 1955/09/17

## 2017-11-28 ENCOUNTER — Other Ambulatory Visit: Payer: Self-pay

## 2017-11-28 ENCOUNTER — Ambulatory Visit (HOSPITAL_COMMUNITY): Payer: BLUE CROSS/BLUE SHIELD | Attending: Orthopaedic Surgery

## 2017-11-28 ENCOUNTER — Encounter (HOSPITAL_COMMUNITY): Payer: Self-pay

## 2017-11-28 DIAGNOSIS — M542 Cervicalgia: Secondary | ICD-10-CM | POA: Diagnosis present

## 2017-11-28 DIAGNOSIS — M62830 Muscle spasm of back: Secondary | ICD-10-CM | POA: Diagnosis present

## 2017-11-28 DIAGNOSIS — R29898 Other symptoms and signs involving the musculoskeletal system: Secondary | ICD-10-CM | POA: Diagnosis present

## 2017-11-28 DIAGNOSIS — M25511 Pain in right shoulder: Secondary | ICD-10-CM | POA: Diagnosis present
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
## 2017-12-25 ENCOUNTER — Ambulatory Visit (INDEPENDENT_AMBULATORY_CARE_PROVIDER_SITE_OTHER): Payer: BLUE CROSS/BLUE SHIELD | Admitting: Orthopaedic Surgery

## 2017-12-25 ENCOUNTER — Encounter (INDEPENDENT_AMBULATORY_CARE_PROVIDER_SITE_OTHER): Payer: Self-pay | Admitting: Orthopaedic Surgery

## 2017-12-25 ENCOUNTER — Other Ambulatory Visit (INDEPENDENT_AMBULATORY_CARE_PROVIDER_SITE_OTHER): Payer: Self-pay | Admitting: Radiology

## 2017-12-25 VITALS — BP 153/96 | HR 76 | Ht 60.0 in | Wt 190.0 lb

## 2017-12-25 DIAGNOSIS — M545 Low back pain, unspecified: Secondary | ICD-10-CM | POA: Insufficient documentation

## 2017-12-25 DIAGNOSIS — G8929 Other chronic pain: Secondary | ICD-10-CM

## 2017-12-25 DIAGNOSIS — M542 Cervicalgia: Secondary | ICD-10-CM | POA: Diagnosis not present
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
## 2018-02-12 ENCOUNTER — Other Ambulatory Visit: Payer: Self-pay | Admitting: Family Medicine

## 2018-02-19 ENCOUNTER — Other Ambulatory Visit: Payer: Self-pay | Admitting: Family Medicine

## 2018-08-07 LAB — HM COLONOSCOPY
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
## 2019-05-15 ENCOUNTER — Other Ambulatory Visit: Payer: Self-pay

## 2019-05-15 DIAGNOSIS — Z20822 Contact with and (suspected) exposure to covid-19: Secondary | ICD-10-CM

## 2019-05-16 LAB — NOVEL CORONAVIRUS, NAA: SARS-CoV-2, NAA: NOT DETECTED

## 2019-07-22 ENCOUNTER — Ambulatory Visit: Payer: BC Managed Care – PPO | Attending: Internal Medicine

## 2019-07-22 ENCOUNTER — Other Ambulatory Visit: Payer: Self-pay

## 2019-07-22 DIAGNOSIS — Z20822 Contact with and (suspected) exposure to covid-19: Secondary | ICD-10-CM

## 2019-07-22 DIAGNOSIS — Z20828 Contact with and (suspected) exposure to other viral communicable diseases: Secondary | ICD-10-CM | POA: Diagnosis not present

## 2019-07-24 LAB — NOVEL CORONAVIRUS, NAA: SARS-CoV-2, NAA: NOT DETECTED

## 2019-07-28 ENCOUNTER — Telehealth: Payer: Self-pay | Admitting: General Practice

## 2019-07-28 NOTE — Telephone Encounter (Signed)
Negative COVID results given. Patient results "NOT Detected." Caller expressed understanding. ° °

## 2019-08-12 ENCOUNTER — Other Ambulatory Visit: Payer: Self-pay

## 2019-08-12 ENCOUNTER — Ambulatory Visit: Payer: Medicare HMO | Attending: Internal Medicine

## 2019-08-12 DIAGNOSIS — Z20822 Contact with and (suspected) exposure to covid-19: Secondary | ICD-10-CM | POA: Diagnosis not present

## 2019-08-13 LAB — NOVEL CORONAVIRUS, NAA: SARS-CoV-2, NAA: NOT DETECTED

## 2019-09-22 DIAGNOSIS — Z0100 Encounter for examination of eyes and vision without abnormal findings: Secondary | ICD-10-CM | POA: Diagnosis not present

## 2019-09-22 DIAGNOSIS — H5200 Hypermetropia, unspecified eye: Secondary | ICD-10-CM | POA: Diagnosis not present

## 2019-12-25 ENCOUNTER — Encounter (HOSPITAL_COMMUNITY): Payer: Self-pay | Admitting: *Deleted

## 2019-12-25 ENCOUNTER — Emergency Department (HOSPITAL_COMMUNITY)
Admission: EM | Admit: 2019-12-25 | Discharge: 2019-12-25 | Disposition: A | Discharge status: Home / Self Care | Payer: Medicare HMO | Attending: Emergency Medicine | Admitting: Emergency Medicine

## 2019-12-25 DIAGNOSIS — E11649 Type 2 diabetes mellitus with hypoglycemia without coma: Secondary | ICD-10-CM | POA: Insufficient documentation

## 2019-12-25 DIAGNOSIS — I10 Essential (primary) hypertension: Secondary | ICD-10-CM | POA: Diagnosis not present

## 2019-12-25 DIAGNOSIS — Z7984 Long term (current) use of oral hypoglycemic drugs: Secondary | ICD-10-CM | POA: Insufficient documentation

## 2019-12-25 DIAGNOSIS — Z79899 Other long term (current) drug therapy: Secondary | ICD-10-CM | POA: Diagnosis not present

## 2019-12-25 DIAGNOSIS — E162 Hypoglycemia, unspecified: Secondary | ICD-10-CM

## 2019-12-25 LAB — URINALYSIS, ROUTINE W REFLEX MICROSCOPIC
Bilirubin Urine: NEGATIVE
Glucose, UA: NEGATIVE mg/dL
Hgb urine dipstick: NEGATIVE
Ketones, ur: NEGATIVE mg/dL
Leukocytes,Ua: NEGATIVE
Nitrite: NEGATIVE
Protein, ur: NEGATIVE mg/dL
Specific Gravity, Urine: 1.008 (ref 1.005–1.030)
pH: 6 (ref 5.0–8.0)

## 2019-12-25 LAB — CBC WITH DIFFERENTIAL/PLATELET
Abs Immature Granulocytes: 0 10*3/uL (ref 0.00–0.07)
Basophils Absolute: 0 10*3/uL (ref 0.0–0.1)
Basophils Relative: 0 %
Eosinophils Absolute: 0.1 10*3/uL (ref 0.0–0.5)
Eosinophils Relative: 1 %
HCT: 40.3 % (ref 36.0–46.0)
Hemoglobin: 12.9 g/dL (ref 12.0–15.0)
Immature Granulocytes: 0 %
Lymphocytes Relative: 21 %
Lymphs Abs: 1.2 10*3/uL (ref 0.7–4.0)
MCH: 28.2 pg (ref 26.0–34.0)
MCHC: 32 g/dL (ref 30.0–36.0)
MCV: 88.2 fL (ref 80.0–100.0)
Monocytes Absolute: 0.4 10*3/uL (ref 0.1–1.0)
Monocytes Relative: 7 %
Neutro Abs: 3.8 10*3/uL (ref 1.7–7.7)
Neutrophils Relative %: 71 %
Platelets: 262 10*3/uL (ref 150–400)
RBC: 4.57 MIL/uL (ref 3.87–5.11)
RDW: 13.5 % (ref 11.5–15.5)
WBC: 5.5 10*3/uL (ref 4.0–10.5)
nRBC: 0 % (ref 0.0–0.2)

## 2019-12-25 LAB — BASIC METABOLIC PANEL
Anion gap: 7 (ref 5–15)
BUN: 11 mg/dL (ref 8–23)
CO2: 30 mmol/L (ref 22–32)
Calcium: 8.9 mg/dL (ref 8.9–10.3)
Chloride: 103 mmol/L (ref 98–111)
Creatinine, Ser: 0.78 mg/dL (ref 0.44–1.00)
GFR calc Af Amer: >60 mL/min (ref >60)
GFR calc non Af Amer: >60 mL/min (ref >60)
Glucose, Bld: 97 mg/dL (ref 70–99)
Potassium: 3.6 mmol/L (ref 3.5–5.1)
Sodium: 140 mmol/L (ref 135–145)

## 2019-12-25 LAB — CBG MONITORING, ED
Glucose-Capillary: 81 mg/dL (ref 70–99)
Glucose-Capillary: 90 mg/dL (ref 70–99)
Glucose-Capillary: 74 mg/dL (ref 70–99)
Glucose-Capillary: 118 mg/dL — ABNORMAL HIGH (ref 70–99)

## 2019-12-25 MED ORDER — SODIUM CHLORIDE 0.9 % IV BOLUS: 500.0000 mL | Freq: Once | INTRAVENOUS | Status: DC

## 2019-12-25 NOTE — ED Provider Notes (Signed)
[facility] EMERGENCY DEPARTMENT Provider Note   CSN: [id number] Arrival date & time: 12/25/19  1205     History Chief Complaint  Patient presents with  . Hypoglycemia    [name] is a 64 y.o. female.  HPI      [name] is a 64 y.o. female, with a history of DM, hyperlipidemia, HTN, presenting to the ED with concern for hypoglycemia.  She states this morning she started to "feel shaky" as well as feeling as though her heart was racing. She is out of her meter strips and so she was unable to measure her blood sugar.   She still did not eat anything before coming into the emergency department.  She states she was started on Tresiba 2 to 3 weeks ago by her PCP.  She was provided samples of this medication, however, she was unable to afford the actual prescriptions.  She ran out of the samples and has not taken this medication for the past 2 days. She also states she has been on glipizide 10mg  daily for several months, but has had no changes in this medication.  The only pain she talks about is some intermittent left flank pain that has been occurring for several months.  The pain is fleeting, lasting for a few seconds at a time, nonradiating, moderate to severe, sharp in nature. She does endorse intermittent headache, bilateral frontal, consistent with previous headaches.  Denies fever/chills, shortness of breath, chest pain, dizziness, syncope, urinary symptoms, N/V/D, or any other complaints.    Past Medical History:  Diagnosis Date  . Bilateral kidney stones 08/30/2017  . Depression   . Diabetes mellitus without complication ([place])   . Fatigue   . Fibroids, intramural 06/12/2017   Has subserosal and submucosal and intramural fibroids   . Hyperlipidemia   . Hypertension   . Snoring   . Thickened endometrium 06/12/2017   Will get endo bx    Patient Active Problem List   Diagnosis Date Noted  . Cervicalgia 12/25/2017  . Chronic bilateral low back pain  without sciatica 12/25/2017  . Bilateral kidney stones 08/30/2017  . Fibroids, intramural 06/12/2017  . Thickened endometrium 06/12/2017  . Type 2 diabetes mellitus without complication, without long-term current use of insulin ([place]) 03/27/2017  . Essential hypertension 03/27/2017  . Hyperlipidemia LDL goal <100 03/27/2017  . OBESITY, NOS 09/26/2006    Past Surgical History:  Procedure Laterality Date  . CESAREAN SECTION     2  . FOOT SURGERY       OB History    Gravida  3   Para  2   Term  2   Preterm  0   AB  1   Living  2     SAB  1   TAB  0   Ectopic  0   Multiple  0   Live Births  0           Family History  Problem Relation Age of Onset  . Kidney disease Mother 65       kidney failure  . Hypertension Mother   . Diabetes Mother   . Arthritis Mother   . Depression Mother   . Alcohol abuse Father   . Cirrhosis Father   . Early death Father 8  . Diabetes Sister   . Cancer Maternal Aunt        ovary  . Cancer Maternal Grandfather        prostate    Social  History   Tobacco Use  . Smoking status: Never Smoker  . Smokeless tobacco: Never Used  Substance Use Topics  . Alcohol use: No    Alcohol/week: 0.0 standard drinks  . Drug use: No    Home Medications Prior to Admission medications   Medication Sig Start Date End Date Taking? Authorizing Provider  atorvastatin (LIPITOR) 40 MG tablet Take 1 tablet (40 mg total) by mouth daily. 08/30/17   Raylene Everts, MD  cyclobenzaprine (FLEXERIL) 5 MG tablet Take 1 tablet (5 mg total) by mouth 3 (three) times daily as needed for muscle spasms. 08/30/17   Raylene Everts, MD  ibuprofen (ADVIL,MOTRIN) 800 MG tablet Take 1 tablet (800 mg total) by mouth every 8 (eight) hours as needed for moderate pain. 08/30/17   Raylene Everts, MD  lisinopril (PRINIVIL,ZESTRIL) 5 MG tablet Take 1 tablet (5 mg total) daily by mouth. 06/05/17   Raylene Everts, MD  metFORMIN (GLUCOPHAGE-XR) 750 MG 24 hr  tablet TAKE 1 TABLET BY MOUTH ONCE DAILY WITH BREAKFAST 10/31/17   Raylene Everts, MD  omeprazole (PRILOSEC) 40 MG capsule Take 1 capsule (40 mg total) by mouth daily. Take in morning empty stomach 11/18/17   Raylene Everts, MD    Allergies    Patient has no known allergies.  Review of Systems   Review of Systems  Constitutional: Negative for chills, diaphoresis and fever.  Respiratory: Negative for cough and shortness of breath.   Cardiovascular: Negative for chest pain.  Gastrointestinal: Negative for abdominal pain, diarrhea, nausea and vomiting.  Genitourinary: Positive for flank pain (chronic, fleeting). Negative for dysuria and hematuria.  Neurological: Positive for headaches. Negative for dizziness, syncope and weakness.  All other systems reviewed and are negative.   Physical Exam Updated Vital Signs BP (!) 141/95 (BP Location: Left Arm)   Pulse 79   Temp 98 F (36.7 C) (Oral)   Resp 16   Ht 5' (1.524 m)   SpO2 97%   BMI 37.11 kg/m   Physical Exam Vitals and nursing note reviewed.  Constitutional:      General: She is not in acute distress.    Appearance: She is well-developed. She is obese. She is not diaphoretic.  HENT:     Head: Normocephalic and atraumatic.     Mouth/Throat:     Mouth: Mucous membranes are moist.     Pharynx: Oropharynx is clear.  Eyes:     Conjunctiva/sclera: Conjunctivae normal.  Cardiovascular:     Rate and Rhythm: Normal rate and regular rhythm.     Pulses: Normal pulses.          Radial pulses are 2+ on the right side and 2+ on the left side.       Posterior tibial pulses are 2+ on the right side and 2+ on the left side.     Heart sounds: Normal heart sounds.     Comments: Tactile temperature in the extremities appropriate and equal bilaterally. Pulmonary:     Effort: Pulmonary effort is normal. No respiratory distress.     Breath sounds: Normal breath sounds.  Abdominal:     Palpations: Abdomen is soft.     Tenderness:  There is no abdominal tenderness. There is no guarding.  Musculoskeletal:     Cervical back: Neck supple.     Right lower leg: No edema.     Left lower leg: No edema.  Lymphadenopathy:     Cervical: No cervical adenopathy.  Skin:  General: Skin is warm and dry.  Neurological:     Mental Status: She is alert.  Psychiatric:        Mood and Affect: Mood and affect normal.        Speech: Speech normal.        Behavior: Behavior normal.     ED Results / Procedures / Treatments   Labs (all labs ordered are listed, but only abnormal results are displayed) Labs Reviewed  URINALYSIS, ROUTINE W REFLEX MICROSCOPIC - Abnormal; Notable for the following components:      Result Value   Color, Urine STRAW (*)    All other components within normal limits  CBG MONITORING, ED - Abnormal; Notable for the following components:   Glucose-Capillary 118 (*)    All other components within normal limits  URINE CULTURE  BASIC METABOLIC PANEL  CBC WITH DIFFERENTIAL/PLATELET  CBG MONITORING, ED  CBG MONITORING, ED  CBG MONITORING, ED    EKG EKG Interpretation  Date/Time:  Friday Dec 25 2019 12:23:17 EDT Ventricular Rate:  83 PR Interval:  152 QRS Duration: 78 QT Interval:  308 QTC Calculation: 361 R Axis:   -17 Text Interpretation: Normal sinus rhythm Normal ECG Confirmed by Elnora Morrison [phone]) on 12/25/2019 3:17:09 PM   Radiology No results found.  Procedures Procedures (including critical care time)  Medications Ordered in ED Medications - No data to display  ED Course  I have reviewed the triage vital signs and the nursing notes.  Pertinent labs & imaging results that were available during my care of the patient were reviewed by me and considered in my medical decision making (see chart for details).    MDM Rules/Calculators/A&P                      Patient presents with concern for hypoglycemia. Patient is nontoxic appearing, afebrile, not tachycardic, not tachypneic,  not hypotensive, maintains excellent SPO2 on room air, and is in no apparent distress.   I have reviewed the patient's chart to obtain more information.   I reviewed and interpreted the patient's labs. Initial CBG was 81, which patient states is low for her.  She was given snacks here in the ED.  Patient states she was previously feeling shaky, however, this was not the case upon ED arrival. Headache resolved after eating snacks here in the ED. Lab work reassuring.  PCP follow-up recommended.  The patient was given instructions for home care as well as return precautions. Patient voices understanding of these instructions, accepts the plan, and is comfortable with discharge.   Final Clinical Impression(s) / ED Diagnoses Final diagnoses:  Hypoglycemia    Rx / DC Orders ED Discharge Orders    None       Layla Maw 12/26/19 2101    Elnora Morrison, MD 01/03/20 [phone]

## 2019-12-25 NOTE — ED Triage Notes (Signed)
C/o feeling jittery today, states she feels like her heart is racing

## 2019-12-25 NOTE — Discharge Instructions (Signed)
Please be sure to follow-up with a primary care provider on this matter. Make sure you are eating regular meals and drinking plenty of water. Return to the emergency department for chest pain, shortness of breath, new abdominal pain, passing out, or any other major concerns.

## 2019-12-25 NOTE — ED Notes (Signed)
Known diabetic   Here for low BS

## 2019-12-25 NOTE — ED Notes (Signed)
Peanut butter, crackers and Diet ginger ale provided  Pt report she usually wears a "patch"  Ran out 2 days ago

## 2019-12-28 LAB — URINE CULTURE: Culture: 40000 — AB

## 2020-09-13 ENCOUNTER — Other Ambulatory Visit: Payer: Self-pay

## 2020-09-13 ENCOUNTER — Encounter: Payer: Self-pay | Admitting: Emergency Medicine

## 2020-09-13 ENCOUNTER — Ambulatory Visit
Admission: EM | Admit: 2020-09-13 | Discharge: 2020-09-13 | Disposition: A | Discharge status: Home / Self Care | Payer: Medicare HMO | Attending: Family Medicine | Admitting: Family Medicine

## 2020-09-13 DIAGNOSIS — E1169 Type 2 diabetes mellitus with other specified complication: Secondary | ICD-10-CM | POA: Diagnosis not present

## 2020-09-13 DIAGNOSIS — R739 Hyperglycemia, unspecified: Secondary | ICD-10-CM

## 2020-09-13 DIAGNOSIS — R300 Dysuria: Secondary | ICD-10-CM

## 2020-09-13 DIAGNOSIS — R81 Glycosuria: Secondary | ICD-10-CM

## 2020-09-13 DIAGNOSIS — N760 Acute vaginitis: Secondary | ICD-10-CM

## 2020-09-13 LAB — POCT URINALYSIS DIP (MANUAL ENTRY)
Bilirubin, UA: NEGATIVE
Glucose, UA: >=1000 mg/dL — AB
Ketones, POC UA: NEGATIVE mg/dL
Leukocytes, UA: NEGATIVE
Nitrite, UA: NEGATIVE
Protein Ur, POC: NEGATIVE mg/dL
Spec Grav, UA: 1.015 (ref 1.010–1.025)
Urobilinogen, UA: 0.2 E.U./dL
pH, UA: 6 (ref 5.0–8.0)

## 2020-09-13 LAB — POCT FASTING CBG KUC MANUAL ENTRY: POCT Glucose (KUC): 261 mg/dL — AB (ref 70–99)

## 2020-09-13 MED ORDER — FLUCONAZOLE 150 MG PO TABS: ORAL_TABLET | ORAL | 0 refills | Status: AC

## 2020-09-13 NOTE — Discharge Instructions (Addendum)
UA is negative for infection, but there is a large amount of glucose in your urine  This can irritate your bladder and cause dysuria as well  Drink plenty of fluids and you may take AZO over the counter as needed  Follow up with this office or with primary care if symptoms are persisting.  Follow up in the ER for high fever, trouble swallowing, trouble breathing, other concerning symptoms.

## 2020-09-13 NOTE — ED Provider Notes (Addendum)
[facility]   CC: UTI  SUBJECTIVE:  [name] is a 65 y.o. female who complains of urinary frequency, urgency and dysuria for the past week. Patient denies a precipitating event, recent sexual encounter, excessive caffeine intake. Localizes the pain to the lower abdomen. Reports vaginal irritation as well. Pain is intermittent and describes it as pressure. Has not tried OTC medications for this.  Symptoms are made worse with urination. Admits to similar symptoms in the past. Has medical hx of diabetes, HTN, HLD. Denies fever, chills, nausea, vomiting, flank pain, abnormal vaginal discharge or bleeding, hematuria.    LMP: No LMP recorded. Patient is postmenopausal.  ROS: As in HPI.  All other pertinent ROS negative.     Past Medical History:  Diagnosis Date  . Bilateral kidney stones 08/30/2017  . Depression   . Diabetes mellitus without complication ([place])   . Fatigue   . Fibroids, intramural 06/12/2017   Has subserosal and submucosal and intramural fibroids   . Hyperlipidemia   . Hypertension   . Snoring   . Thickened endometrium 06/12/2017   Will get endo bx   Past Surgical History:  Procedure Laterality Date  . CESAREAN SECTION     2  . FOOT SURGERY     No Known Allergies No current facility-administered medications on file prior to encounter.   Current Outpatient Medications on File Prior to Encounter  Medication Sig Dispense Refill  . atorvastatin (LIPITOR) 40 MG tablet Take 1 tablet (40 mg total) by mouth daily. 90 tablet 3  . cyclobenzaprine (FLEXERIL) 5 MG tablet Take 1 tablet (5 mg total) by mouth 3 (three) times daily as needed for muscle spasms. 30 tablet 1  . ibuprofen (ADVIL,MOTRIN) 800 MG tablet Take 1 tablet (800 mg total) by mouth every 8 (eight) hours as needed for moderate pain. 50 tablet 1  . lisinopril (PRINIVIL,ZESTRIL) 5 MG tablet Take 1 tablet (5 mg total) daily by mouth. 90 tablet 3  . metFORMIN (GLUCOPHAGE-XR) 750 MG 24 hr tablet  TAKE 1 TABLET BY MOUTH ONCE DAILY WITH BREAKFAST 90 tablet 0  . omeprazole (PRILOSEC) 40 MG capsule Take 1 capsule (40 mg total) by mouth daily. Take in morning empty stomach 30 capsule 1   Social History   Socioeconomic History  . Marital status: Married    Spouse name: Dominica Severin  . Number of children: 2  . Years of education: 12th grd  . Highest education level: Not on file  Occupational History  . 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 Smoking status: Never Smoker  . Smokeless tobacco: Never Used  Vaping Use  . Vaping Use: Never used  Substance and Sexual Activity  . Alcohol use: No    Alcohol/week: 0.0 standard drinks  . Drug use: No  . Sexual activity: Yes    Birth control/protection: Post-menopausal  Other Topics Concern  . Not on file  Social History Narrative   Lives with Curt Bears daughter    Valentino Saxon to walk   Social Determinants of Health   Financial Resource Strain: Not on file  Food Insecurity: Not on file  Transportation Needs: Not on file  Physical Activity: Not on file  Stress: Not on file  Social Connections: Not on file  Intimate Partner Violence: Not on file   Family History  Problem Relation Age of Onset  . Kidney disease Mother 74       kidney failure  . Hypertension Mother   . Diabetes Mother   .  Arthritis Mother   . Depression Mother   . Alcohol abuse Father   . Cirrhosis Father   . Early death Father 25  . Diabetes Sister   . Cancer Maternal Aunt        ovary  . Cancer Maternal Grandfather        prostate    OBJECTIVE:  Vitals:   09/13/20 1026  BP: (!) 146/84  Pulse: 79  Resp: 18  Temp: 97.6 F (36.4 C)  TempSrc: Oral  SpO2: 97%   General appearance: AOx3 in no acute distress HEENT: NCAT. Oropharynx clear.  Lungs: clear to auscultation bilaterally without adventitious breath sounds Heart: regular rate and rhythm. Radial pulses 2+ symmetrical bilaterally Abdomen: soft; non-distended; no tenderness; bowel sounds present;  no guarding or rebound tenderness Back: no CVA tenderness Extremities: no edema; symmetrical with no gross deformities Skin: warm and dry Neurologic: Ambulates from chair to exam table without difficulty Psychological: alert and cooperative; normal mood and affect  Labs Reviewed  POCT URINALYSIS DIP (MANUAL ENTRY) - Abnormal; Notable for the following components:      Result Value   Glucose, UA >=1,000 (*)    Blood, UA trace-intact (*)    All other components within normal limits  POCT FASTING CBG [facility] MANUAL ENTRY - Abnormal; Notable for the following components:   POCT Glucose ([facility]) 261 (*)    All other components within normal limits    ASSESSMENT & PLAN:  1. Hyperglycemia   2. Dysuria   3. Glucosuria   4. Type 2 diabetes mellitus with other specified complication, without long-term current use of insulin ([facility])    UA negative for infection Glucose present  Office CBG 261 Likely yeast with history and hyperglycemia Prescribed fluconazole Push fluids and get plenty of rest Take pyridium as prescribed and as needed for symptomatic relief Follow up with PCP if symptoms persists Return here or go to ER if you have any new or worsening symptoms such as fever, worsening abdominal pain, nausea/vomiting, flank pain  Outlined signs and symptoms indicating need for more acute intervention Patient verbalized understanding After Visit Summary given     Faustino Congress, NP 09/13/20 [place]    Faustino Congress, NP 09/13/20 1052

## 2020-09-13 NOTE — ED Triage Notes (Signed)
Pressure and pain and burning upon urination over the past week

## 2020-12-06 ENCOUNTER — Ambulatory Visit
Admission: EM | Admit: 2020-12-06 | Discharge: 2020-12-06 | Disposition: A | Discharge status: Home / Self Care | Payer: Medicare HMO | Attending: Family Medicine | Admitting: Family Medicine

## 2020-12-06 ENCOUNTER — Other Ambulatory Visit: Payer: Self-pay

## 2020-12-06 DIAGNOSIS — I10 Essential (primary) hypertension: Secondary | ICD-10-CM

## 2020-12-06 DIAGNOSIS — S61210A Laceration without foreign body of right index finger without damage to nail, initial encounter: Secondary | ICD-10-CM

## 2020-12-06 DIAGNOSIS — E785 Hyperlipidemia, unspecified: Secondary | ICD-10-CM | POA: Diagnosis not present

## 2020-12-06 LAB — POCT FASTING CBG KUC MANUAL ENTRY: POCT Glucose (KUC): 155 mg/dL — AB (ref 70–99)

## 2020-12-06 NOTE — Discharge Instructions (Signed)
We placed 3 sutures in your finger today.  Follow-up in 5 to 7 days to have these removed.  Follow up with this office or with primary care for increased swelling, redness, tenderness, warmth, drainage from the area.  Follow up in the ER for red streaking up your hand, high fever, trouble swallowing, trouble breathing, other concerning symptoms.

## 2020-12-06 NOTE — ED Triage Notes (Signed)
Pt presents with right index finger laceration that occurred this morning , bleeding controlled. Pts tetanus is up to date

## 2020-12-06 NOTE — ED Provider Notes (Signed)
[facility]    CSN: [id number] Arrival date & time: 12/06/20  0906      History   Chief Complaint Chief Complaint  Patient presents with  . Laceration    HPI [name] is a 65 y.o. female.   Reports laceration to R index finger. States that she locked herself out of the house earlier today and used a sharp object that she was trying to unlock the door, then cut her finger. States that Tdap is UTD. Denies drainage from the area, uncontrolled bleeding, foreign body to the area, erythema, tenderness, heat surrounding the area.   ROS per HPI  The history is provided by the patient.    Past Medical History:  Diagnosis Date  . Bilateral kidney stones 08/30/2017  . Depression   . Diabetes mellitus without complication ([place])   . Fatigue   . Fibroids, intramural 06/12/2017   Has subserosal and submucosal and intramural fibroids   . Hyperlipidemia   . Hypertension   . Snoring   . Thickened endometrium 06/12/2017   Will get endo bx    Patient Active Problem List   Diagnosis Date Noted  . Cervicalgia 12/25/2017  . Chronic bilateral low back pain without sciatica 12/25/2017  . Bilateral kidney stones 08/30/2017  . Fibroids, intramural 06/12/2017  . Thickened endometrium 06/12/2017  . Type 2 diabetes mellitus without complication, without long-term current use of insulin ([place]) 03/27/2017  . Essential hypertension 03/27/2017  . Hyperlipidemia LDL goal <100 03/27/2017  . OBESITY, NOS 09/26/2006    Past Surgical History:  Procedure Laterality Date  . CESAREAN SECTION     2  . FOOT SURGERY      OB History    Gravida  3   Para  2   Term  2   Preterm  0   AB  1   Living  2     SAB  1   IAB  0   Ectopic  0   Multiple  0   Live Births  0            Home Medications    Prior to Admission medications   Medication Sig Start Date End Date Taking? Authorizing Provider  atorvastatin (LIPITOR) 40 MG tablet Take 1 tablet (40 mg  total) by mouth daily. 08/30/17   Raylene Everts, MD  cyclobenzaprine (FLEXERIL) 5 MG tablet Take 1 tablet (5 mg total) by mouth 3 (three) times daily as needed for muscle spasms. 08/30/17   Raylene Everts, MD  fluconazole (DIFLUCAN) 150 MG tablet Take one tablet at the onset of symptoms, if still having symptoms in 3 days, take the second tablet. 09/13/20   Faustino Congress, NP  ibuprofen (ADVIL,MOTRIN) 800 MG tablet Take 1 tablet (800 mg total) by mouth every 8 (eight) hours as needed for moderate pain. 08/30/17   Raylene Everts, MD  lisinopril (PRINIVIL,ZESTRIL) 5 MG tablet Take 1 tablet (5 mg total) daily by mouth. 06/05/17   Raylene Everts, MD  metFORMIN (GLUCOPHAGE-XR) 750 MG 24 hr tablet TAKE 1 TABLET BY MOUTH ONCE DAILY WITH BREAKFAST 10/31/17   Raylene Everts, MD  omeprazole (PRILOSEC) 40 MG capsule Take 1 capsule (40 mg total) by mouth daily. Take in morning empty stomach 11/18/17   Raylene Everts, MD    Family History Family History  Problem Relation Age of Onset  . Kidney disease Mother 59       kidney failure  . Hypertension Mother   .  Diabetes Mother   . Arthritis Mother   . Depression Mother   . Alcohol abuse Father   . Cirrhosis Father   . Early death Father 37  . Diabetes Sister   . Cancer Maternal Aunt        ovary  . Cancer Maternal Grandfather        prostate    Social History Social History   Tobacco Use  . Smoking status: Never Smoker  . Smokeless tobacco: Never Used  Vaping Use  . Vaping Use: Never used  Substance Use Topics  . Alcohol use: No    Alcohol/week: 0.0 standard drinks  . Drug use: No     Allergies   Patient has no known allergies.   Review of Systems Review of Systems   Physical Exam Triage Vital Signs ED Triage Vitals  Enc Vitals Group     BP 12/06/20 0949 (!) 189/97     Pulse Rate 12/06/20 0949 67     Resp 12/06/20 0949 17     Temp 12/06/20 0949 97.8 F (36.6 C)     Temp Source 12/06/20 0949 Tympanic      SpO2 12/06/20 0949 97 %     Weight --      Height --      Head Circumference --      Peak Flow --      Pain Score 12/06/20 0953 4     Pain Loc --      Pain Edu? --      Excl. in [place]? --    No data found.  Updated Vital Signs BP (!) 177/88   Pulse 67   Temp 97.8 F (36.6 C) (Tympanic)   Resp 17   SpO2 97%   Visual Acuity Right Eye Distance:   Left Eye Distance:   Bilateral Distance:    Right Eye Near:   Left Eye Near:    Bilateral Near:     Physical Exam Vitals and nursing note reviewed.  Constitutional:      General: She is not in acute distress.    Appearance: Normal appearance. She is well-developed and normal weight. She is not ill-appearing.  HENT:     Head: Normocephalic and atraumatic.     Nose: Nose normal.     Mouth/Throat:     Mouth: Mucous membranes are moist.     Pharynx: Oropharynx is clear.  Eyes:     Extraocular Movements: Extraocular movements intact.     Conjunctiva/sclera: Conjunctivae normal.     Pupils: Pupils are equal, round, and reactive to light.  Cardiovascular:     Rate and Rhythm: Normal rate and regular rhythm.  Pulmonary:     Effort: Pulmonary effort is normal.  Musculoskeletal:        General: Normal range of motion.     Cervical back: Normal range of motion and neck supple.  Skin:    General: Skin is warm and dry.     Capillary Refill: Capillary refill takes less than 2 seconds.     Findings: Laceration present.          Comments: Area of 0.5cm laceration to the tip of the R index finger  Neurological:     General: No focal deficit present.     Mental Status: She is alert and oriented to person, place, and time.  Psychiatric:        Mood and Affect: Mood normal.        Behavior: Behavior normal.  Thought Content: Thought content normal.      UC Treatments / Results  Labs (all labs ordered are listed, but only abnormal results are displayed) Labs Reviewed  POCT FASTING CBG [facility] MANUAL ENTRY - Abnormal; Notable  for the following components:      Result Value   POCT Glucose ([facility]) 155 (*)    All other components within normal limits    EKG   Radiology No results found.  Procedures Laceration Repair  Date/Time: 12/06/2020 11:14 AM Performed by: Faustino Congress, NP Authorized by: Faustino Congress, NP   Consent:    Consent obtained:  Verbal   Consent given by:  Patient   Risks discussed:  Infection, pain and need for additional repair   Alternatives discussed:  Observation Universal protocol:    Procedure explained and questions answered to patient or proxy's satisfaction: yes     Patient identity confirmed:  Verbally with patient and arm band Anesthesia:    Anesthesia method:  Topical application and local infiltration   Topical anesthetic:  LET   Local anesthetic:  Lidocaine 2% w/o epi Laceration details:    Location:  Finger   Finger location:  R index finger   Length (cm):  0.5   Depth (mm):  3 Pre-procedure details:    Preparation:  Patient was prepped and draped in usual sterile fashion Exploration:    Hemostasis achieved with:  Direct pressure Treatment:    Area cleansed with:  Shur-Clens   Amount of cleaning:  Standard   Visualized foreign bodies/material removed: no     Debridement:  None   Undermining:  None   Scar revision: no   Skin repair:    Repair method:  Sutures   Suture size:  4-0   Suture material:  Prolene   Suture technique:  Simple interrupted   Number of sutures:  3 Approximation:    Approximation:  Close Repair type:    Repair type:  Simple Post-procedure details:    Dressing:  Bulky dressing   Procedure completion:  Tolerated well, no immediate complications   (including critical care time)  Medications Ordered in UC Medications - No data to display  Initial Impression / Assessment and Plan / UC Course  I have reviewed the triage vital signs and the nursing notes.  Pertinent labs & imaging results that were available during my  care of the patient were reviewed by me and considered in my medical decision making (see chart for details).    Laceration to R index finger Hypertension  Discussed BP in office and states that she was unable to take her blood pressure medication this morning and states that she gets very anxious in doctor's offices. Laceration repaired as above Follow up in about 7 days for suture removal Follow up with this office or with primary care for increased swelling, redness, tenderness, warmth, drainage from the area.  Follow up in the ER for red streaking up your hand, high fever, trouble swallowing, trouble breathing, other concerning symptoms.   Final Clinical Impressions(s) / UC Diagnoses   Final diagnoses:  Laceration of right index finger without foreign body without damage to nail, initial encounter  Hypertension, unspecified type     Discharge Instructions     We placed 3 sutures in your finger today.  Follow-up in 5 to 7 days to have these removed.  Follow up with this office or with primary care for increased swelling, redness, tenderness, warmth, drainage from the area.  Follow up in the  ER for red streaking up your hand, high fever, trouble swallowing, trouble breathing, other concerning symptoms.     ED Prescriptions    None     PDMP not reviewed this encounter.   Faustino Congress, NP 12/08/20 1746

## 2021-02-23 ENCOUNTER — Encounter: Payer: Self-pay | Admitting: Emergency Medicine

## 2021-02-23 ENCOUNTER — Ambulatory Visit
Admission: EM | Admit: 2021-02-23 | Discharge: 2021-02-23 | Disposition: A | Discharge status: Home / Self Care | Payer: Medicare HMO | Attending: Emergency Medicine | Admitting: Emergency Medicine

## 2021-02-23 DIAGNOSIS — E1165 Type 2 diabetes mellitus with hyperglycemia: Secondary | ICD-10-CM

## 2021-02-23 DIAGNOSIS — Z20822 Contact with and (suspected) exposure to covid-19: Secondary | ICD-10-CM

## 2021-02-23 LAB — POCT FASTING CBG KUC MANUAL ENTRY: POCT Glucose (KUC): 305 mg/dL — AB (ref 70–99)

## 2021-02-23 NOTE — Discharge Instructions (Signed)
COVID testing ordered.  It will take between 5-7 days for test results.  Someone will contact you regarding abnormal results.    In the meantime: You should remain isolated in your home for 10 days from symptom onset AND greater than 72 hours after symptoms resolution (absence of fever without the use of fever-reducing medication and improvement in respiratory symptoms), whichever is longer Get plenty of rest and push fluids Use OTC zyrtec for nasal congestion, runny nose, and/or sore throat Use OTC flonase for nasal congestion and runny nose Use medications daily for symptom relief Use OTC medications like ibuprofen or tylenol as needed fever or pain Call or go to the ED if you have any new or worsening symptoms such as fever, worsening cough, shortness of breath, chest tightness, chest pain, turning blue, changes in mental status, etc..Marland Kitchen

## 2021-02-23 NOTE — ED Triage Notes (Signed)
Sore throat, fatigue, headaches.  exposed to co-workers with covid.  Symptoms worse today.  Started to feel bad over the weekend.

## 2021-02-23 NOTE — ED Provider Notes (Signed)
[place]   [id number] 02/23/21 Arrival Time: [id number]   CC: COVID symptoms  SUBJECTIVE: History from: patient.  [name] is a 65 y.o. female who presents with sore throat, fatigue, headache 5-6 days.  Exposed to co-workers with covid.  Denies alleviating or aggravating factors.  Denies previous symptoms in the past.   Denies fever, chills, sinus pain, rhinorrhea, SOB, wheezing, chest pain, nausea, changes in bowel or bladder habits.    Patient also requests to have blood sugar checked.  Has hx of DM.  Has materials at home to check sugar, but would like it checked here.    ROS: As per HPI.  All other pertinent ROS negative.     Past Medical History:  Diagnosis Date   Bilateral kidney stones 08/30/2017   Depression    Diabetes mellitus without complication ([place])    Fatigue    Fibroids, intramural 06/12/2017   Has subserosal and submucosal and intramural fibroids    Hyperlipidemia    Hypertension    Snoring    Thickened endometrium 06/12/2017   Will get endo bx   Past Surgical History:  Procedure Laterality Date   CESAREAN SECTION     2   FOOT SURGERY     No Known Allergies No current facility-administered medications on file prior to encounter.   Current Outpatient Medications on File Prior to Encounter  Medication Sig Dispense Refill   atorvastatin (LIPITOR) 40 MG tablet Take 1 tablet (40 mg total) by mouth daily. 90 tablet 3   cyclobenzaprine (FLEXERIL) 5 MG tablet Take 1 tablet (5 mg total) by mouth 3 (three) times daily as needed for muscle spasms. 30 tablet 1   fluconazole (DIFLUCAN) 150 MG tablet Take one tablet at the onset of symptoms, if still having symptoms in 3 days, take the second tablet. 2 tablet 0   ibuprofen (ADVIL,MOTRIN) 800 MG tablet Take 1 tablet (800 mg total) by mouth every 8 (eight) hours as needed for moderate pain. 50 tablet 1   lisinopril (PRINIVIL,ZESTRIL) 5 MG tablet Take 1 tablet (5 mg total) daily by mouth. 90 tablet 3    metFORMIN (GLUCOPHAGE-XR) 750 MG 24 hr tablet TAKE 1 TABLET BY MOUTH ONCE DAILY WITH BREAKFAST 90 tablet 0   omeprazole (PRILOSEC) 40 MG capsule Take 1 capsule (40 mg total) by mouth daily. 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   Tobacco Use   Smoking status: Never   Smokeless tobacco: Never  Vaping Use   Vaping Use: Never used  Substance and Sexual Activity   Alcohol use: No    Alcohol/week: 0.0 standard drinks   Drug use: No   Sexual activity: Yes    Birth control/protection: Post-menopausal  Other Topics Concern   Not on file  Social History Narrative   Lives with Curt Bears daughter    Valentino Saxon to walk   Social Determinants of Health   Financial Resource Strain: Not on file  Food Insecurity: Not on file  Transportation Needs: Not on file  Physical Activity: Not on file  Stress: Not on file  Social Connections: Not on file  Intimate Partner Violence: Not on file   Family History  Problem Relation Age of  Onset   Kidney disease Mother 36       kidney failure   Hypertension Mother    Diabetes Mother    Arthritis Mother    Depression Mother    Alcohol abuse Father    Cirrhosis Father    Early death Father 14   Diabetes Sister    Cancer Maternal Aunt        ovary   Cancer Maternal Grandfather        prostate    OBJECTIVE:  Vitals:   02/23/21 0821  BP: (!) 155/89  Pulse: 85  Resp: 16  Temp: 98.9 F (37.2 C)  TempSrc: Tympanic  SpO2: 99%     General appearance: alert; appears fatigued, but nontoxic; speaking in full sentences and tolerating own secretions HEENT: NCAT; Ears: EACs clear, TMs pearly gray; Eyes: PERRL.  EOM grossly intact. Sinuses: nontender; Nose: nares patent without rhinorrhea, Throat: oropharynx  clear, tonsils non erythematous or enlarged, uvula midline  Neck: supple without LAD Lungs: unlabored respirations, symmetrical air entry; cough: absent; no respiratory distress; CTAB Heart: regular rate and rhythm.  Skin: warm and dry Psychological: alert and cooperative; normal mood and affect  ASSESSMENT & PLAN:  1. Exposure to COVID-19 virus   2. Type 2 diabetes mellitus with hyperglycemia, unspecified whether long term insulin use ([place])     COVID testing ordered.  It will take between 5-7 days for test results.  Someone will contact you regarding abnormal results.    In the meantime: You should remain isolated in your home for 10 days from symptom onset AND greater than 72 hours after symptoms resolution (absence of fever without the use of fever-reducing medication and improvement in respiratory symptoms), whichever is longer Get plenty of rest and push fluids Use OTC zyrtec for nasal congestion, runny nose, and/or sore throat Use OTC flonase for nasal congestion and runny nose Use medications daily for symptom relief Use OTC medications like ibuprofen or tylenol as needed fever or pain Call or go to the ED if you have any new or worsening symptoms such as fever, worsening cough, shortness of breath, chest tightness, chest pain, turning blue, changes in mental status, etc...   Unable to manage DM with hyperglycemia in urgent care setting.  Offered patient further evaluation and management in the ED.  Patient declines at this time and would like to try outpatient therapy first.  Aware of the risk associated with this decision including missed diagnosis, organ damage, organ failure, and/or death.  Patient aware and in agreement.     Reviewed expectations re: course of current medical issues. Questions answered. Outlined signs and symptoms indicating need for more acute intervention. Patient verbalized understanding. After Visit Summary given.          Stacey Drain Pooler,  PA-C 02/23/21 [phone]

## 2021-02-24 LAB — COVID-19, FLU A+B NAA
Influenza A, NAA: NOT DETECTED
Influenza B, NAA: NOT DETECTED
SARS-CoV-2, NAA: DETECTED — AB

## 2021-02-27 ENCOUNTER — Emergency Department (HOSPITAL_COMMUNITY)
Admission: EM | Admit: 2021-02-27 | Discharge: 2021-02-27 | Disposition: A | Discharge status: Home / Self Care | Payer: Medicare HMO | Attending: Emergency Medicine | Admitting: Emergency Medicine

## 2021-02-27 ENCOUNTER — Encounter (HOSPITAL_COMMUNITY): Payer: Self-pay | Admitting: Emergency Medicine

## 2021-02-27 ENCOUNTER — Other Ambulatory Visit: Payer: Self-pay

## 2021-02-27 DIAGNOSIS — Z79899 Other long term (current) drug therapy: Secondary | ICD-10-CM | POA: Insufficient documentation

## 2021-02-27 DIAGNOSIS — U071 COVID-19: Secondary | ICD-10-CM | POA: Insufficient documentation

## 2021-02-27 DIAGNOSIS — E119 Type 2 diabetes mellitus without complications: Secondary | ICD-10-CM | POA: Diagnosis not present

## 2021-02-27 DIAGNOSIS — Z7984 Long term (current) use of oral hypoglycemic drugs: Secondary | ICD-10-CM | POA: Insufficient documentation

## 2021-02-27 DIAGNOSIS — I10 Essential (primary) hypertension: Secondary | ICD-10-CM | POA: Insufficient documentation

## 2021-02-27 DIAGNOSIS — R197 Diarrhea, unspecified: Secondary | ICD-10-CM | POA: Diagnosis not present

## 2021-02-27 DIAGNOSIS — Z760 Encounter for issue of repeat prescription: Secondary | ICD-10-CM | POA: Diagnosis not present

## 2021-02-27 MED ORDER — LISINOPRIL 5 MG PO TABS: 5.0000 mg | ORAL_TABLET | Freq: Every day | ORAL | 3 refills | Status: DC

## 2021-02-27 MED ORDER — BENZONATATE 100 MG PO CAPS
100.0000 mg | ORAL_CAPSULE | Freq: Three times a day (TID) | ORAL | 0 refills | Status: DC
Start: 2021-02-27 — End: 2022-11-27

## 2021-02-27 NOTE — ED Provider Notes (Signed)
[facility] Provider Note   CSN: [id number] Arrival date & time: 02/27/21  1215     History No chief complaint on file.   [name] is a 65 y.o. female.  HPI  Patient presents with COVID symptoms x9 days.  She was seen at urgent care on Saturday and tested positive for COVID.  Total symptoms include diarrhea, body aches, fever, chills, headache, loss of sense and smell.  She has not tried any alleviating medicines, denies any aggravating medicines.  She is vaccinated and boosted.  She also complains of being without her diabetes and blood pressure medicine for "a long while".  She does not know what diabetes medicine she is supposed to take, denies it is metformin.  She is not having any changes in vision, chest pain, shortness of breath.  Past Medical History:  Diagnosis Date  . Bilateral kidney stones 08/30/2017  . Depression   . Diabetes mellitus without complication ([place])   . Fatigue   . Fibroids, intramural 06/12/2017   Has subserosal and submucosal and intramural fibroids   . Hyperlipidemia   . Hypertension   . Snoring   . Thickened endometrium 06/12/2017   Will get endo bx    Patient Active Problem List   Diagnosis Date Noted  . Cervicalgia 12/25/2017  . Chronic bilateral low back pain without sciatica 12/25/2017  . Bilateral kidney stones 08/30/2017  . Fibroids, intramural 06/12/2017  . Thickened endometrium 06/12/2017  . Type 2 diabetes mellitus without complication, without long-term current use of insulin ([place]) 03/27/2017  . Essential hypertension 03/27/2017  . Hyperlipidemia LDL goal <100 03/27/2017  . OBESITY, NOS 09/26/2006    Past Surgical History:  Procedure Laterality Date  . CESAREAN SECTION     2  . FOOT SURGERY       OB History     Gravida  3   Para  2   Term  2   Preterm  0   AB  1   Living  2      SAB  1   IAB  0   Ectopic  0   Multiple  0   Live Births  0           Family  History  Problem Relation Age of Onset  . Kidney disease Mother 42       kidney failure  . Hypertension Mother   . Diabetes Mother   . Arthritis Mother   . Depression Mother   . Alcohol abuse Father   . Cirrhosis Father   . Early death Father 85  . Diabetes Sister   . Cancer Maternal Aunt        ovary  . Cancer Maternal Grandfather        prostate    Social History   Tobacco Use  . Smoking status: Never  . Smokeless tobacco: Never  Vaping Use  . Vaping Use: Never used  Substance Use Topics  . Alcohol use: No    Alcohol/week: 0.0 standard drinks  . Drug use: No    Home Medications Prior to Admission medications   Medication Sig Start Date End Date Taking? Authorizing Provider  atorvastatin (LIPITOR) 40 MG tablet Take 1 tablet (40 mg total) by mouth daily. 08/30/17   Raylene Everts, MD  cyclobenzaprine (FLEXERIL) 5 MG tablet Take 1 tablet (5 mg total) by mouth 3 (three) times daily as needed for muscle spasms. 08/30/17   Raylene Everts, MD  fluconazole (  DIFLUCAN) 150 MG tablet Take one tablet at the onset of symptoms, if still having symptoms in 3 days, take the second tablet. 09/13/20   Faustino Congress, NP  ibuprofen (ADVIL,MOTRIN) 800 MG tablet Take 1 tablet (800 mg total) by mouth every 8 (eight) hours as needed for moderate pain. 08/30/17   Raylene Everts, MD  lisinopril (ZESTRIL) 5 MG tablet Take 1 tablet (5 mg total) by mouth daily. 02/27/21   Sherrill Raring, PA-C  metFORMIN (GLUCOPHAGE-XR) 750 MG 24 hr tablet TAKE 1 TABLET BY MOUTH ONCE DAILY WITH BREAKFAST 10/31/17   Raylene Everts, MD  omeprazole (PRILOSEC) 40 MG capsule Take 1 capsule (40 mg total) by mouth daily. Take in morning empty stomach 11/18/17   Raylene Everts, MD    Allergies    Patient has no known allergies.  Review of Systems   Review of Systems  Constitutional:  Positive for chills and fever.  HENT:  Positive for congestion.   Respiratory:  Positive for cough. Negative for shortness of  breath.   Gastrointestinal:  Positive for diarrhea.  Neurological:  Positive for headaches.   Physical Exam Updated Vital Signs BP (!) 180/96 (BP Location: Right Arm)   Pulse 72   Temp 98.4 F (36.9 C) (Oral)   Resp 20   Ht 5' (1.524 m)   Wt 86.2 kg   SpO2 100%   BMI 37.11 kg/m   Physical Exam Vitals and nursing note reviewed. Exam conducted with a chaperone present.  Constitutional:      General: She is not in acute distress.    Appearance: Normal appearance.  HENT:     Head: Normocephalic and atraumatic.  Eyes:     General: No scleral icterus.    Extraocular Movements: Extraocular movements intact.     Pupils: Pupils are equal, round, and reactive to light.  Skin:    Coloration: Skin is not jaundiced.  Neurological:     Mental Status: She is alert. Mental status is at baseline.     Coordination: Coordination normal.   ED Results / Procedures / Treatments   Labs (all labs ordered are listed, but only abnormal results are displayed) Labs Reviewed - No data to display  EKG None  Radiology No results found.  Procedures Procedures   Medications Ordered in ED Medications - No data to display  ED Course  I have reviewed the triage vital signs and the nursing notes.  Pertinent labs & imaging results that were available during my care of the patient were reviewed by me and considered in my medical decision making (see chart for details).    MDM Rules/Calculators/A&P                           I will refill her lisinopril because she is able to tell me the dose and a.m. and that is verified by what I have documented on epic.  She does not know what diabetes medicine she is supposed to be on, and the most recent medicine I have documented is metformin which she claims she is not on.  Will not refill that, I have advised her strongly to follow-up with a primary care doctor in the next week to get refills of her medicine and for follow-up.  She verbalized  understanding with this plan.  I suspect her symptoms are due to COVID, she is not febrile right now although she is mildly hypertensive.  Her vitals are stable,  her lungs are clear to auscultation bilaterally.  Do not suspect pneumonia.  Patient is appropriate for discharge at this time with follow-up instructions clearly discussed and agreed upon.  Final Clinical Impression(s) / ED Diagnoses Final diagnoses:  None    Rx / DC Orders ED Discharge Orders          Ordered    lisinopril (ZESTRIL) 5 MG tablet  Daily        02/27/21 1259             Sherrill Raring, PA-C 02/27/21 1312    Lajean Saver, MD 03/01/21 [phone]

## 2021-02-27 NOTE — Discharge Instructions (Addendum)
Take Tylenol as needed for body aches and headache. You can take [employer] as needed every 6 hours for cough. Continue taking the remaining diabetic medicine that you have. Take 5 mg lisinopril daily as prescribed. Please follow-up with your primary care doctor in the next 2 weeks for blood pressure recheck and for refills of medication. If you develop chest pain, shortness of breath, vision changes please return back to the ED for further evaluation.

## 2021-02-27 NOTE — ED Triage Notes (Signed)
Diagnosed covid positive on Saturday. Pt states "I've been sick ever since and not getting any better." Denies using any otc meds for symptom management.

## 2021-07-11 ENCOUNTER — Emergency Department (HOSPITAL_COMMUNITY)
Admission: EM | Admit: 2021-07-11 | Discharge: 2021-07-11 | Disposition: A | Discharge status: Home / Self Care | Payer: Medicare HMO | Attending: Emergency Medicine | Admitting: Emergency Medicine

## 2021-07-11 ENCOUNTER — Encounter (HOSPITAL_COMMUNITY): Payer: Self-pay | Admitting: Emergency Medicine

## 2021-07-11 ENCOUNTER — Emergency Department (HOSPITAL_COMMUNITY): Payer: Medicare HMO

## 2021-07-11 ENCOUNTER — Other Ambulatory Visit: Payer: Self-pay

## 2021-07-11 DIAGNOSIS — Z760 Encounter for issue of repeat prescription: Secondary | ICD-10-CM | POA: Insufficient documentation

## 2021-07-11 DIAGNOSIS — R0789 Other chest pain: Secondary | ICD-10-CM | POA: Diagnosis not present

## 2021-07-11 DIAGNOSIS — E1169 Type 2 diabetes mellitus with other specified complication: Secondary | ICD-10-CM | POA: Diagnosis not present

## 2021-07-11 DIAGNOSIS — Z20822 Contact with and (suspected) exposure to covid-19: Secondary | ICD-10-CM | POA: Diagnosis not present

## 2021-07-11 DIAGNOSIS — R110 Nausea: Secondary | ICD-10-CM | POA: Diagnosis not present

## 2021-07-11 DIAGNOSIS — R519 Headache, unspecified: Secondary | ICD-10-CM | POA: Insufficient documentation

## 2021-07-11 DIAGNOSIS — E041 Nontoxic single thyroid nodule: Secondary | ICD-10-CM | POA: Diagnosis not present

## 2021-07-11 DIAGNOSIS — R079 Chest pain, unspecified: Secondary | ICD-10-CM | POA: Diagnosis not present

## 2021-07-11 DIAGNOSIS — I10 Essential (primary) hypertension: Secondary | ICD-10-CM | POA: Diagnosis not present

## 2021-07-11 DIAGNOSIS — I6601 Occlusion and stenosis of right middle cerebral artery: Secondary | ICD-10-CM | POA: Diagnosis not present

## 2021-07-11 DIAGNOSIS — R1030 Lower abdominal pain, unspecified: Secondary | ICD-10-CM | POA: Insufficient documentation

## 2021-07-11 DIAGNOSIS — Z7984 Long term (current) use of oral hypoglycemic drugs: Secondary | ICD-10-CM | POA: Diagnosis not present

## 2021-07-11 DIAGNOSIS — I6522 Occlusion and stenosis of left carotid artery: Secondary | ICD-10-CM | POA: Diagnosis not present

## 2021-07-11 DIAGNOSIS — R0602 Shortness of breath: Secondary | ICD-10-CM | POA: Diagnosis not present

## 2021-07-11 DIAGNOSIS — Z79899 Other long term (current) drug therapy: Secondary | ICD-10-CM | POA: Diagnosis not present

## 2021-07-11 DIAGNOSIS — M542 Cervicalgia: Secondary | ICD-10-CM | POA: Diagnosis not present

## 2021-07-11 LAB — URINALYSIS, MICROSCOPIC (REFLEX): Bacteria, UA: NONE SEEN

## 2021-07-11 LAB — BASIC METABOLIC PANEL
Anion gap: 7 (ref 5–15)
BUN: 9 mg/dL (ref 8–23)
CO2: 28 mmol/L (ref 22–32)
Calcium: 9.5 mg/dL (ref 8.9–10.3)
Chloride: 105 mmol/L (ref 98–111)
Creatinine, Ser: 0.92 mg/dL (ref 0.44–1.00)
GFR, Estimated: >60 mL/min (ref >60)
Glucose, Bld: 302 mg/dL — ABNORMAL HIGH (ref 70–99)
Potassium: 3.2 mmol/L — ABNORMAL LOW (ref 3.5–5.1)
Sodium: 140 mmol/L (ref 135–145)

## 2021-07-11 LAB — CBC
HCT: 39.7 % (ref 36.0–46.0)
Hemoglobin: 12.8 g/dL (ref 12.0–15.0)
MCH: 27.9 pg (ref 26.0–34.0)
MCHC: 32.2 g/dL (ref 30.0–36.0)
MCV: 86.7 fL (ref 80.0–100.0)
Platelets: 276 10*3/uL (ref 150–400)
RBC: 4.58 MIL/uL (ref 3.87–5.11)
RDW: 12.9 % (ref 11.5–15.5)
WBC: 5.7 10*3/uL (ref 4.0–10.5)
nRBC: 0 % (ref 0.0–0.2)

## 2021-07-11 LAB — URINALYSIS, ROUTINE W REFLEX MICROSCOPIC
Bilirubin Urine: NEGATIVE
Glucose, UA: >=500 mg/dL — AB
Hgb urine dipstick: NEGATIVE
Ketones, ur: NEGATIVE mg/dL
Leukocytes,Ua: NEGATIVE
Nitrite: NEGATIVE
Protein, ur: NEGATIVE mg/dL
Specific Gravity, Urine: 1.01 (ref 1.005–1.030)
pH: 6 (ref 5.0–8.0)

## 2021-07-11 LAB — RESP PANEL BY RT-PCR (FLU A&B, COVID) ARPGX2
Influenza A by PCR: NEGATIVE
Influenza B by PCR: NEGATIVE
SARS Coronavirus 2 by RT PCR: NEGATIVE

## 2021-07-11 LAB — TROPONIN I (HIGH SENSITIVITY)
Troponin I (High Sensitivity): 9 ng/L (ref <18)
Troponin I (High Sensitivity): 7 ng/L (ref <18)

## 2021-07-11 MED ORDER — ACETAMINOPHEN 325 MG PO TABS
325.0000 mg | ORAL_TABLET | Freq: Once | ORAL | Status: AC
  Administered 2021-07-11: 325 mg via ORAL
  Filled 2021-07-11: qty 1

## 2021-07-11 MED ORDER — IOHEXOL 350 MG/ML SOLN
75.0000 mL | Freq: Once | INTRAVENOUS | Status: AC | PRN
  Administered 2021-07-11: 75 mL via INTRAVENOUS

## 2021-07-11 MED ORDER — ATORVASTATIN CALCIUM 10 MG PO TABS: 10.0000 mg | ORAL_TABLET | Freq: Every day | ORAL | 0 refills | Status: DC

## 2021-07-11 MED ORDER — SODIUM CHLORIDE 0.9 % IV BOLUS: 1000.0000 mL | Freq: Once | INTRAVENOUS | Status: DC

## 2021-07-11 MED ORDER — DIPHENHYDRAMINE HCL 50 MG/ML IJ SOLN
25.0000 mg | Freq: Once | INTRAMUSCULAR | Status: AC
  Administered 2021-07-11: 25 mg via INTRAVENOUS
  Filled 2021-07-11: qty 1

## 2021-07-11 MED ORDER — GLIPIZIDE 5 MG PO TABS
2.5000 mg | ORAL_TABLET | Freq: Once | ORAL | Status: DC
  Filled 2021-07-11: qty 0.5

## 2021-07-11 MED ORDER — METOCLOPRAMIDE HCL 5 MG/ML IJ SOLN
10.0000 mg | Freq: Once | INTRAMUSCULAR | Status: AC
  Administered 2021-07-11: 10 mg via INTRAVENOUS
  Filled 2021-07-11: qty 2

## 2021-07-11 MED ORDER — LISINOPRIL 5 MG PO TABS: 5.0000 mg | ORAL_TABLET | Freq: Every day | ORAL | 0 refills | Status: DC

## 2021-07-11 MED ORDER — GLIPIZIDE 5 MG PO TABS: 2.5000 mg | ORAL_TABLET | Freq: Every day | ORAL | 0 refills | Status: DC

## 2021-07-11 MED ORDER — GLIPIZIDE 2.5 MG HALF TABLET
2.5000 mg | ORAL_TABLET | Freq: Once | ORAL | Status: DC
  Filled 2021-07-11 (×3): qty 1

## 2021-07-11 NOTE — ED Triage Notes (Addendum)
Pt woke up at 4am with headache and nausea.  Reports intermittent L sided chest pain that radiates to L neck and L side of her face for "a while"- unable to say how long.  States she did have chest pain while sitting in lobby for triage but denies at present.  Went to [facility] and sent to ED for abnormal EKG.  SOB with exertion.  No neuro deficits noted.

## 2021-07-11 NOTE — ED Notes (Signed)
PA approved pt to start medication 07/12/2021. Educated pt how to take medication and pt verbalized understanding.

## 2021-07-11 NOTE — Discharge Instructions (Signed)
Take all medications as prescribed.  Take the glipizide daily before meals.  Take a half a tablet a day.  Closely monitor your sugars while starting this medicine to ensure they do not go too low.  If your sugars are dropping too low, stop the medicine until you follow-up with your primary care doctor. Take the lisinopril and atorvastatin daily as prescribed. Use Tylenol or ibuprofen as needed for headache, chest pain, or lower abdominal pain. Your scan today showed a thyroid nodule.  This is most likely nonconcerning, but should be followed up with your primary care doctor for further evaluation. Your work-up today did not show any concerning cause for your headache, chest pain, or abdominal pain. Return to the emergency room with any new, worsening, or concerning symptoms

## 2021-07-11 NOTE — ED Provider Notes (Signed)
[facility] EMERGENCY DEPARTMENT Provider Note   CSN: [id number] Arrival date & time: 07/11/21  1502     History Chief Complaint  Patient presents with   Chest Pain   Headache    [name] is a 65 y.o. female presenting for evaluation of headache and nausea.  Patient states he was woken from sleep around 4 This morning with a left-sided headache that radiates into her left neck.  Initially, she also had chest pain, but this is since resolved.  She reports intermittent left-sided chest pain for the past several months.  She also reports intermittent lower abdominal pain for the past few months, no pain currently.  She has a history of diabetes and hypertension, but has been out of her medicine for several months to years.  No other medical problems.  No history of heart problems.  She does have a history of headaches previously, has never been on anything for it regularly.  She has not taken anything for her head including Tylenol ibuprofen.  No vision changes or slurred speech.  No numbness or tingling.  No fever, shortness of breath, cough, abnormal bowel movements.  She does report dysuria, but no hematuria.  She does have urinary frequency.   Pt was seen at [facility] today, reportedly had an abnormal EKG, and was send to the ER for further evaluation.   HPI     Past Medical History:  Diagnosis Date   Bilateral kidney stones 08/30/2017   Depression    Diabetes mellitus without complication ([place])    Fatigue    Fibroids, intramural 06/12/2017   Has subserosal and submucosal and intramural fibroids    Hyperlipidemia    Hypertension    Snoring    Thickened endometrium 06/12/2017   Will get endo bx    Patient Active Problem List   Diagnosis Date Noted   Cervicalgia 12/25/2017   Chronic bilateral low back pain without sciatica 12/25/2017   Bilateral kidney stones 08/30/2017   Fibroids, intramural 06/12/2017   Thickened endometrium 06/12/2017   Type 2  diabetes mellitus without complication, without long-term current use of insulin ([place]) 03/27/2017   Essential hypertension 03/27/2017   Hyperlipidemia LDL goal <100 03/27/2017   OBESITY, NOS 09/26/2006    Past Surgical History:  Procedure Laterality Date   CESAREAN SECTION     2   FOOT SURGERY       OB History     Gravida  3   Para  2   Term  2   Preterm  0   AB  1   Living  2      SAB  1   IAB  0   Ectopic  0   Multiple  0   Live Births  0           Family History  Problem Relation Age of Onset   Kidney disease Mother 8       kidney failure   Hypertension Mother    Diabetes Mother    Arthritis Mother    Depression Mother    Alcohol abuse Father    Cirrhosis Father    Early death Father 12   Diabetes Sister    Cancer Maternal Aunt        ovary   Cancer Maternal Grandfather        prostate    Social History   Tobacco Use   Smoking status: Never   Smokeless tobacco: Never  Vaping Use  Vaping Use: Never used  Substance Use Topics   Alcohol use: No    Alcohol/week: 0.0 standard drinks   Drug use: No    Home Medications Prior to Admission medications   Medication Sig Start Date End Date Taking? Authorizing Provider  atorvastatin (LIPITOR) 10 MG tablet Take 1 tablet (10 mg total) by mouth daily. 07/11/21 08/10/21 Yes Starkeisha Vanwinkle, PA-C  glipiZIDE (GLUCOTROL) 5 MG tablet Take 0.5 tablets (2.5 mg total) by mouth daily before breakfast. 07/11/21  Yes Iori Gigante, PA-C  lisinopril (ZESTRIL) 5 MG tablet Take 1 tablet (5 mg total) by mouth daily. 07/11/21  Yes Adryanna Friedt, PA-C  benzonatate (TESSALON) 100 MG capsule Take 1 capsule (100 mg total) by mouth every 8 (eight) hours. Patient not taking: Reported on 07/11/2021 02/27/21   Sherrill Raring, PA-C  cyclobenzaprine (FLEXERIL) 5 MG tablet Take 1 tablet (5 mg total) by mouth 3 (three) times daily as needed for muscle spasms. Patient not taking: Reported on 07/11/2021 08/30/17    Raylene Everts, MD  fluconazole (DIFLUCAN) 150 MG tablet Take one tablet at the onset of symptoms, if still having symptoms in 3 days, take the second tablet. Patient not taking: Reported on 07/11/2021 09/13/20   Faustino Congress, NP  ibuprofen (ADVIL,MOTRIN) 800 MG tablet Take 1 tablet (800 mg total) by mouth every 8 (eight) hours as needed for moderate pain. Patient not taking: Reported on 07/11/2021 08/30/17   Raylene Everts, MD  metFORMIN (GLUCOPHAGE-XR) 750 MG 24 hr tablet TAKE 1 TABLET BY MOUTH ONCE DAILY WITH BREAKFAST Patient not taking: Reported on 07/11/2021 10/31/17   Raylene Everts, MD  omeprazole (PRILOSEC) 40 MG capsule Take 1 capsule (40 mg total) by mouth daily. Take in morning empty stomach Patient not taking: Reported on 07/11/2021 11/18/17   Raylene Everts, MD    Allergies    Patient has no known allergies.  Review of Systems   Review of Systems  Cardiovascular:  Positive for chest pain (intermittent, none currently).  Gastrointestinal:  Positive for abdominal pain (lower abd, intermittent, none currently) and nausea.  Musculoskeletal:  Positive for neck pain.  Neurological:  Positive for headaches.  All other systems reviewed and are negative.  Physical Exam Updated Vital Signs BP 140/85    Pulse 65    Temp 98.9 F (37.2 C) (Oral)    Resp 14    Ht _0  (1.499 m)    Wt 86.2 kg    SpO2 100%    BMI 38.38 kg/m   Physical Exam Vitals and nursing note reviewed.  Constitutional:      General: She is not in acute distress.    Appearance: Normal appearance. She is obese.  HENT:     Head: Normocephalic and atraumatic.  Eyes:     Extraocular Movements: Extraocular movements intact.     Conjunctiva/sclera: Conjunctivae normal.     Pupils: Pupils are equal, round, and reactive to light.  Cardiovascular:     Rate and Rhythm: Normal rate and regular rhythm.     Pulses: Normal pulses.  Pulmonary:     Effort: Pulmonary effort is normal. No respiratory  distress.     Breath sounds: Normal breath sounds. No wheezing.     Comments: Speaking in full sentences.  Clear lung sounds in all fields. Abdominal:     General: There is no distension.     Palpations: Abdomen is soft. There is no mass.     Tenderness: There is no abdominal tenderness. There  is no guarding or rebound.  Musculoskeletal:        General: Normal range of motion.     Cervical back: Normal range of motion and neck supple.  Skin:    General: Skin is warm and dry.     Capillary Refill: Capillary refill takes less than 2 seconds.  Neurological:     General: No focal deficit present.     Mental Status: She is alert and oriented to person, place, and time.     GCS: GCS eye subscore is 4. GCS verbal subscore is 5. GCS motor subscore is 6.     Cranial Nerves: Cranial nerves 2-12 are intact.     Sensory: Sensation is intact.     Motor: Motor function is intact.  Psychiatric:        Mood and Affect: Mood and affect normal.        Speech: Speech normal.        Behavior: Behavior normal.    ED Results / Procedures / Treatments   Labs (all labs ordered are listed, but only abnormal results are displayed) Labs Reviewed  BASIC METABOLIC PANEL - Abnormal; Notable for the following components:      Result Value   Potassium 3.2 (*)    Glucose, Bld 302 (*)    All other components within normal limits  URINALYSIS, ROUTINE W REFLEX MICROSCOPIC - Abnormal; Notable for the following components:   Glucose, UA >=500 (*)    All other components within normal limits  RESP PANEL BY RT-PCR (FLU A&B, COVID) ARPGX2  CBC  URINALYSIS, MICROSCOPIC (REFLEX)  TROPONIN I (HIGH SENSITIVITY)  TROPONIN I (HIGH SENSITIVITY)    EKG EKG Interpretation  Date/Time:  Tuesday July 11 2021 16:35:56 EST Ventricular Rate:  80 PR Interval:  148 QRS Duration: 84 QT Interval:  334 QTC Calculation: 385 R Axis:   4 Text Interpretation: Normal sinus rhythm Nonspecific ST and T wave abnormality  Abnormal ECG No significant change since last tracing Confirmed by Calvert Cantor [phone]) on 07/11/2021 6:01:09 PM  Radiology CT ANGIO HEAD NECK W WO CM  Result Date: 07/11/2021 CLINICAL DATA:  Provided history: Headache, sudden, severe. Additional history provided: Patient reports shortness of breath on exertion, headache, nausea, left-sided chest pain that radiates to left side of neck. EXAM: CT ANGIOGRAPHY HEAD AND NECK TECHNIQUE: Multidetector CT imaging of the head and neck was performed using the standard protocol during bolus administration of intravenous contrast. Multiplanar CT image reconstructions and MIPs were obtained to evaluate the vascular anatomy. Carotid stenosis measurements (when applicable) are obtained utilizing NASCET criteria, using the distal internal carotid diameter as the denominator. CONTRAST:  63m OMNIPAQUE IOHEXOL 350 MG/ML SOLN COMPARISON:  CT head/cervical spine 08/20/2017. FINDINGS: CT HEAD FINDINGS Brain: Cerebral volume is normal for age. There is no acute intracranial hemorrhage. No demarcated cortical infarct. No extra-axial fluid collection. No evidence of an intracranial mass. No midline shift. Vascular: No hyperdense vessel. Skull: Normal. Negative for fracture or focal lesion. Sinuses: No significant paranasal sinus disease. Orbits: No acute or significant orbital finding. Review of the MIP images confirms the above findings CTA NECK FINDINGS Aortic arch: Common origin of the innominate and left common carotid arteries. The visualized aortic arch is normal in caliber. Streak and beam hardening artifact arising from a dense right-sided contrast bolus partially obscures the right subclavian artery. Within this limitation, there is no appreciable hemodynamically significant innominate or proximal subclavian artery stenosis. Right carotid system: In beam hardening artifact arising from  a dense right-sided contrast bolus partially obscures the proximal right common  carotid artery. Within this limitation, the CCA and ICA are patent within the neck without stenosis or significant atherosclerotic disease. Left carotid system: CCA and ICA patent within the neck without stenosis or significant atherosclerotic disease. Vertebral arteries: Vertebral arteries patent within the neck without stenosis. The left vertebral artery is dominant. Skeleton: Cervical spondylosis, greatest at C5-C6. No acute bony abnormality or aggressive osseous lesion. Other neck: Heterogeneously enhancing 2 cm right thyroid lobe nodule. No pathologically enlarged cervical chain lymph nodes. Upper chest: No consolidation within the imaged lung apices. Review of the MIP images confirms the above findings CTA HEAD FINDINGS Anterior circulation: The intracranial internal carotid arteries are patent. Minimal calcified plaque within the intracranial left ICA without stenosis. The M1 middle cerebral arteries are patent. Moderate stenosis within a superior division mid M2 right MCA vessel (series 14, image 14). No M2 proximal branch occlusion is identified. The A1 left anterior cerebral artery is hypoplastic or absent the anterior cerebral arteries are supplied predominantly or entirely by the right A1 segment. Partially azygos configuration of the anterior cerebral arteries. 1-2 mm inferiorly projecting vascular protrusion arising from the supraclinoid right ICA, which may reflect an infundibulum or aneurysm. Posterior circulation: The intracranial vertebral arteries are patent. The basilar artery is patent. The posterior cerebral arteries are patent. A small left posterior communicating artery is present. The right posterior communicating artery is diminutive or absent. Venous sinuses: Within the limitations of contrast timing, no convincing thrombus. Anatomic variants: As described. Review of the MIP images confirms the above findings IMPRESSION: CT head: No evidence of acute intracranial abnormality. CTA neck: 1.  Streak and beam hardening artifact arising from a dense right-sided contrast bolus partially obscures the proximal right common carotid artery. Within this limitation, the common carotid and internal carotid arteries are patent within the neck without appreciable stenosis or significant atherosclerotic disease. No evidence of a section. 2. Vertebral arteries patent within the neck without stenosis or evidence of dissection. 3. Heterogeneously enhancing 2 cm right thyroid lobe nodule. A non-emergent thyroid ultrasound is recommended for further evaluation. 4. Cervical spondylosis, greatest at C5-C6. CTA head: 1. No intracranial large vessel occlusion is identified. 2. Minimal non-stenotic calcified plaque within the intracranial left ICA. 3. Moderate stenosis within a mid M2 right MCA vessel. 4. Variant anterior cerebral artery anatomy, as described. 5. 1-2 mm infundibulum versus aneurysm arising from the supraclinoid right ICA. Electronically Signed   By: Kellie Simmering D.O.   On: 07/11/2021 21:11   DG Chest 2 View  Result Date: 07/11/2021 CLINICAL DATA:  Chest pain EXAM: CHEST - 2 VIEW COMPARISON:  01/10/2008 FINDINGS: The heart size and mediastinal contours are within normal limits. Both lungs are clear. The visualized skeletal structures are unremarkable. IMPRESSION: Negative. Electronically Signed   By: Rolm Baptise M.D.   On: 07/11/2021 17:23    Procedures Procedures   Medications Ordered in ED Medications  sodium chloride 0.9 % bolus 1,000 mL (has no administration in time range)  glipiZIDE (GLUCOTROL) tablet 2.5 mg (has no administration in time range)  acetaminophen (TYLENOL) tablet 325 mg (325 mg Oral Given 07/11/21 1829)  metoCLOPramide (REGLAN) injection 10 mg (10 mg Intravenous Given 07/11/21 1951)  diphenhydrAMINE (BENADRYL) injection 25 mg (25 mg Intravenous Given 07/11/21 1948)  iohexol (OMNIPAQUE) 350 MG/ML injection 75 mL (75 mLs Intravenous Contrast Given 07/11/21 2031)    ED  Course  I have reviewed the triage vital signs and  the nursing notes.  Pertinent labs & imaging results that were available during my care of the patient were reviewed by me and considered in my medical decision making (see chart for details).    MDM Rules/Calculators/A&P                           Patient presenting for evaluation of head and neck pain.  On exam, pt appears nontoxic. She had cp earlier, but this has resolved. This appears to be a more chronic issue, happening intermittently for several months. However considering her uncontrolled DM and HTN, will check troponin x2. HA is likely benign, but with acute onset and pain radiating to the neck, will chest CTA to ensure no blockage, bleed, mass. Will tx with HA cocktail. Ekg obtained from triage similar to previous, no evidence for acute ischemia.   Labs interpreted by me, overall reassuring. Electrolytes stable. Hgb stable. Trop negative x2. Bgl mildly elevated, but no evidence of DKA or acidosis. CXR viewed and independently interpreted by me, no pna, pnx, effusion. CTA head and neck negative for acute findings. Shows a thyroid nodule, will have pt f/u with PCP.   On reevaluation, pt states HA completely resolved. Repeat neuro exam normal. Doubt acute or life threatening intracranial pathology at this time. CSW met with pt, she has a PCP apt for next month. Will give 1 month supply of home meds (pt states she is on lisinopril, glipizide, and atorvastatin). At this time, pt appears safe for d/c. Return precautions given. Pt states she understands and agrees to plan.   Final Clinical Impression(s) / ED Diagnoses Final diagnoses:  Acute nonintractable headache, unspecified headache type  Intermittent chest pain  Intermittent lower abdominal pain  Medication refill  Thyroid nodule    Rx / DC Orders ED Discharge Orders          Ordered    glipiZIDE (GLUCOTROL) 5 MG tablet  Daily before breakfast        07/11/21 2131    lisinopril  (ZESTRIL) 5 MG tablet  Daily        07/11/21 2131    atorvastatin (LIPITOR) 10 MG tablet  Daily        07/11/21 2131             Franchot Heidelberg, PA-C 07/11/21 2148    Truddie Hidden, MD 07/11/21 2152

## 2021-07-11 NOTE — ED Notes (Signed)
PA at the bedside.

## 2021-07-11 NOTE — ED Provider Notes (Signed)
Emergency Medicine Provider Triage Evaluation Note  [name] , a 65 y.o. female  was evaluated in triage.  Pt complains of headache, left-sided chest pain, left-sided neck pain.  She reports the symptoms have been ongoing for months intermittently and today it has been constant and associated with the headache.  She also reports lower abdominal pain which is been on and off for months along with dysuria.  Review of Systems  Positive: Headache, chest pain, dysuria Negative: Shortness of breath, palpitations  Physical Exam  BP 128/72 (BP Location: Left Arm)    Pulse 76    Temp 98.9 F (37.2 C) (Oral)    Resp 14    SpO2 98%  Gen:   Awake, no distress   Resp:  Normal effort  MSK:   Moves extremities without difficulty  Other:    Medical Decision Making  Medically screening exam initiated at 4:41 PM.  Appropriate orders placed.  Lanore Naomee Nowland was informed that the remainder of the evaluation will be completed by another provider, this initial triage assessment does not replace that evaluation, and the importance of remaining in the ED until their evaluation is complete.     Evlyn Courier, PA-C 07/11/21 1643    Lacretia Leigh, MD 07/11/21 [phone]

## 2021-07-11 NOTE — ED Notes (Signed)
Pt given a warm blanket 

## 2021-07-11 NOTE — Social Work (Signed)
CSW met with Pt and daughter at bedside. Pt reports that she has been unhappy with previous PCP and had issues during pandemic maintaining appointments. Pt states that she now has new PCP but will not be able to get an appointment until January due to recent insurance change. New PCP is Iora with [facility] on [address]  [phone].  EDP updated.

## 2021-08-04 ENCOUNTER — Other Ambulatory Visit: Payer: Self-pay | Admitting: Internal Medicine

## 2021-08-04 DIAGNOSIS — E041 Nontoxic single thyroid nodule: Secondary | ICD-10-CM

## 2021-08-23 ENCOUNTER — Ambulatory Visit
Admission: RE | Admit: 2021-08-23 | Discharge: 2021-08-23 | Disposition: A | Discharge status: Home / Self Care | Payer: Medicaid Other | Source: Ambulatory Visit | Attending: Internal Medicine | Admitting: Internal Medicine

## 2021-08-23 DIAGNOSIS — E041 Nontoxic single thyroid nodule: Secondary | ICD-10-CM

## 2021-11-10 ENCOUNTER — Other Ambulatory Visit: Payer: Self-pay | Admitting: Family Medicine

## 2021-11-10 DIAGNOSIS — D3500 Benign neoplasm of unspecified adrenal gland: Secondary | ICD-10-CM

## 2022-11-27 ENCOUNTER — Ambulatory Visit
Admission: EM | Admit: 2022-11-27 | Discharge: 2022-11-27 | Disposition: A | Discharge status: Home / Self Care | Payer: Medicare Other | Attending: Nurse Practitioner | Admitting: Nurse Practitioner

## 2022-11-27 DIAGNOSIS — J069 Acute upper respiratory infection, unspecified: Secondary | ICD-10-CM | POA: Diagnosis present

## 2022-11-27 DIAGNOSIS — Z1152 Encounter for screening for COVID-19: Secondary | ICD-10-CM

## 2022-11-27 MED ORDER — BENZONATATE 100 MG PO CAPS: 100.0000 mg | ORAL_CAPSULE | Freq: Three times a day (TID) | ORAL | 0 refills | Status: AC | PRN

## 2022-11-27 NOTE — Discharge Instructions (Addendum)
You have a viral upper respiratory infection.  Symptoms should improve over the next week to 10 days.  If you develop chest pain or shortness of breath, go to the emergency room.  We have tested you today for COVID-19.  You will see the results in Mychart and we will call you with positive results.  Please stay home and isolate until you are aware of the results.    Some things that can make you feel better are: - Increased rest - Increasing fluid with water/sugar free electrolytes - Acetaminophen and ibuprofen as needed for fever/pain - Salt water gargling, chloraseptic spray and throat lozenges for sore throat - OTC guaifenesin (Mucinex) 600 mg twice daily for congestion - Saline sinus flushes or a neti pot - Humidifying the air -Tessalon Perles during the day as needed for dry cough 

## 2022-11-27 NOTE — ED Provider Notes (Signed)
[facility]    CSN: [id number] Arrival date & time: 11/27/22  1311      History   Chief Complaint Chief Complaint  Patient presents with   URI   Headache    HPI [name] is a 67 y.o. female.   Patient presents today for 3-day history of chills, tactile fever, body aches, congested and dry cough, runny nose, sore throat, sneezing, headache, bilateral ear pressure without drainage, nausea without vomiting, decreased appetite, lack of taste, and fatigue.  She denies shortness of breath or chest pain, stuffy nose, abdominal pain,, diarrhea, and new rash.  No known sick contacts.  Has been taking NyQuil which seems to help with symptoms temporarily.    Past Medical History:  Diagnosis Date   Bilateral kidney stones 08/30/2017   Depression    Diabetes mellitus without complication (HCC)    Fatigue    Fibroids, intramural 06/12/2017   Has subserosal and submucosal and intramural fibroids    Hyperlipidemia    Hypertension    Snoring    Thickened endometrium 06/12/2017   Will get endo bx    Patient Active Problem List   Diagnosis Date Noted   Cervicalgia 12/25/2017   Chronic bilateral low back pain without sciatica 12/25/2017   Bilateral kidney stones 08/30/2017   Fibroids, intramural 06/12/2017   Thickened endometrium 06/12/2017   Type 2 diabetes mellitus without complication, without long-term current use of insulin (HCC) 03/27/2017   Essential hypertension 03/27/2017   Hyperlipidemia LDL goal <100 03/27/2017   OBESITY, NOS 09/26/2006    Past Surgical History:  Procedure Laterality Date   CESAREAN SECTION     2   FOOT SURGERY      OB History     Gravida  3   Para  2   Term  2   Preterm  0   AB  1   Living  2      SAB  1   IAB  0   Ectopic  0   Multiple  0   Live Births  0            Home Medications    Prior to Admission medications   Medication Sig Start Date End Date Taking? Authorizing Provider   atorvastatin (LIPITOR) 10 MG tablet Take 1 tablet (10 mg total) by mouth daily. 07/11/21 08/10/21  Caccavale, Sophia, PA-C  benzonatate (TESSALON) 100 MG capsule Take 1 capsule (100 mg total) by mouth 3 (three) times daily as needed for cough. Do not take with alcohol or while driving or operating heavy machinery.  May cause drowsiness. 11/27/22   Valentino Nose, NP  cyclobenzaprine (FLEXERIL) 5 MG tablet Take 1 tablet (5 mg total) by mouth 3 (three) times daily as needed for muscle spasms. Patient not taking: Reported on 07/11/2021 08/30/17   Eustace Moore, MD  fluconazole (DIFLUCAN) 150 MG tablet Take one tablet at the onset of symptoms, if still having symptoms in 3 days, take the second tablet. Patient not taking: Reported on 07/11/2021 09/13/20   Moshe Cipro, NP  glipiZIDE (GLUCOTROL) 5 MG tablet Take 0.5 tablets (2.5 mg total) by mouth daily before breakfast. 07/11/21   Caccavale, Sophia, PA-C  ibuprofen (ADVIL,MOTRIN) 800 MG tablet Take 1 tablet (800 mg total) by mouth every 8 (eight) hours as needed for moderate pain. Patient not taking: Reported on 07/11/2021 08/30/17   Eustace Moore, MD  lisinopril (ZESTRIL) 5 MG tablet Take 1 tablet (5 mg total) by  mouth daily. 07/11/21   Caccavale, Sophia, PA-C  metFORMIN (GLUCOPHAGE-XR) 750 MG 24 hr tablet TAKE 1 TABLET BY MOUTH ONCE DAILY WITH BREAKFAST Patient not taking: Reported on 07/11/2021 10/31/17   Eustace Moore, MD  omeprazole (PRILOSEC) 40 MG capsule Take 1 capsule (40 mg total) by mouth daily. Take in morning empty stomach Patient not taking: Reported on 07/11/2021 11/18/17   Eustace Moore, MD    Family History Family History  Problem Relation Age of Onset   Kidney disease Mother 32       kidney failure   Hypertension Mother    Diabetes Mother    Arthritis Mother    Depression Mother    Alcohol abuse Father    Cirrhosis Father    Early death Father 2   Diabetes Sister    Cancer Maternal Aunt         ovary   Cancer Maternal Grandfather        prostate    Social History Social History   Tobacco Use   Smoking status: Never   Smokeless tobacco: Never  Vaping Use   Vaping Use: Never used  Substance Use Topics   Alcohol use: No    Alcohol/week: 0.0 standard drinks of alcohol   Drug use: No     Allergies   Patient has no known allergies.   Review of Systems Review of Systems Per HPI  Physical Exam Triage Vital Signs ED Triage Vitals  Enc Vitals Group     BP 11/27/22 1326 139/85     Pulse Rate 11/27/22 1325 85     Resp 11/27/22 1325 17     Temp 11/27/22 1325 98.3 F (36.8 C)     Temp Source 11/27/22 1325 Oral     SpO2 11/27/22 1325 97 %     Weight --      Height --      Head Circumference --      Peak Flow --      Pain Score 11/27/22 1324 5     Pain Loc --      Pain Edu? --      Excl. in GC? --    No data found.  Updated Vital Signs BP 139/85   Pulse 85   Temp 98.3 F (36.8 C) (Oral)   Resp 17   SpO2 97%   Visual Acuity Right Eye Distance:   Left Eye Distance:   Bilateral Distance:    Right Eye Near:   Left Eye Near:    Bilateral Near:     Physical Exam Vitals and nursing note reviewed.  Constitutional:      General: She is not in acute distress.    Appearance: Normal appearance. She is not ill-appearing or toxic-appearing.  HENT:     Head: Normocephalic and atraumatic.     Right Ear: Tympanic membrane, ear canal and external ear normal.     Left Ear: Tympanic membrane, ear canal and external ear normal.     Nose: No congestion or rhinorrhea.     Mouth/Throat:     Mouth: Mucous membranes are moist.     Pharynx: Oropharynx is clear. No oropharyngeal exudate or posterior oropharyngeal erythema.  Eyes:     General: No scleral icterus.    Extraocular Movements: Extraocular movements intact.  Cardiovascular:     Rate and Rhythm: Normal rate and regular rhythm.  Pulmonary:     Effort: Pulmonary effort is normal. No respiratory distress.  Breath sounds: Normal breath sounds. No wheezing, rhonchi or rales.  Abdominal:     General: Abdomen is flat. Bowel sounds are normal. There is no distension.     Palpations: Abdomen is soft.     Tenderness: There is no abdominal tenderness. There is no guarding.  Musculoskeletal:     Cervical back: Normal range of motion and neck supple.  Lymphadenopathy:     Cervical: No cervical adenopathy.  Skin:    General: Skin is warm and dry.     Coloration: Skin is not jaundiced or pale.     Findings: No erythema or rash.  Neurological:     Mental Status: She is alert and oriented to person, place, and time.  Psychiatric:        Behavior: Behavior is cooperative.      UC Treatments / Results  Labs (all labs ordered are listed, but only abnormal results are displayed) Labs Reviewed  SARS CORONAVIRUS 2 (TAT 6-24 HRS)    EKG   Radiology No results found.  Procedures Procedures (including critical care time)  Medications Ordered in UC Medications - No data to display  Initial Impression / Assessment and Plan / UC Course  I have reviewed the triage vital signs and the nursing notes.  Pertinent labs & imaging results that were available during my care of the patient were reviewed by me and considered in my medical decision making (see chart for details).   Patient is well-appearing, normotensive, afebrile, not tachycardic, not tachypneic, oxygenating well on room air.    1. Viral URI with cough 2. Encounter for screening for COVID-19 Suspect viral etiology Vital signs and examination today reassuring COVID 19 testing obtained Patient is a candidate for molnupiravir if she tests positive Supportive care discussed with patient-start cough suppressant medication Strict ER and return precautions discussed with patient  The patient was given the opportunity to ask questions.  All questions answered to their satisfaction.  The patient is in agreement to this plan.    Final  Clinical Impressions(s) / UC Diagnoses   Final diagnoses:  Viral URI with cough  Encounter for screening for COVID-19     Discharge Instructions      You have a viral upper respiratory infection.  Symptoms should improve over the next week to 10 days.  If you develop chest pain or shortness of breath, go to the emergency room.  We have tested you today for COVID-19.  You will see the results in Mychart and we will call you with positive results.  Please stay home and isolate until you are aware of the results.    Some things that can make you feel better are: - Increased rest - Increasing fluid with water/sugar free electrolytes - Acetaminophen and ibuprofen as needed for fever/pain - Salt water gargling, chloraseptic spray and throat lozenges for sore throat - OTC guaifenesin (Mucinex) 600 mg twice daily for congestion - Saline sinus flushes or a neti pot - Humidifying the air -Tessalon Perles during the day as needed for dry cough      ED Prescriptions     Medication Sig Dispense Auth. Provider   benzonatate (TESSALON) 100 MG capsule Take 1 capsule (100 mg total) by mouth 3 (three) times daily as needed for cough. Do not take with alcohol or while driving or operating heavy machinery.  May cause drowsiness. 21 capsule Valentino Nose, NP      PDMP not reviewed this encounter.   Valentino Nose, NP  11/27/22 1343  

## 2022-11-27 NOTE — ED Triage Notes (Signed)
Pt presents with chills, fatigue,  sore throat, headache, nausea, and post nasal drainage X 3 days.

## 2022-11-28 LAB — SARS CORONAVIRUS 2 (TAT 6-24 HRS): SARS Coronavirus 2: NEGATIVE

## 2023-05-20 ENCOUNTER — Other Ambulatory Visit: Payer: Self-pay | Admitting: Nurse Practitioner

## 2023-05-20 DIAGNOSIS — E041 Nontoxic single thyroid nodule: Secondary | ICD-10-CM

## 2023-12-05 ENCOUNTER — Other Ambulatory Visit: Payer: Self-pay

## 2023-12-05 ENCOUNTER — Encounter (HOSPITAL_COMMUNITY): Payer: Self-pay

## 2023-12-05 ENCOUNTER — Emergency Department (HOSPITAL_COMMUNITY)

## 2023-12-05 ENCOUNTER — Emergency Department (HOSPITAL_COMMUNITY)
Admission: EM | Admit: 2023-12-05 | Discharge: 2023-12-05 | Disposition: A | Discharge status: Home / Self Care | Attending: Emergency Medicine | Admitting: Emergency Medicine

## 2023-12-05 DIAGNOSIS — E041 Nontoxic single thyroid nodule: Secondary | ICD-10-CM | POA: Diagnosis not present

## 2023-12-05 DIAGNOSIS — R739 Hyperglycemia, unspecified: Secondary | ICD-10-CM

## 2023-12-05 DIAGNOSIS — R079 Chest pain, unspecified: Secondary | ICD-10-CM | POA: Insufficient documentation

## 2023-12-05 DIAGNOSIS — M542 Cervicalgia: Secondary | ICD-10-CM

## 2023-12-05 LAB — CBC WITH DIFFERENTIAL/PLATELET
Abs Immature Granulocytes: 0.02 10*3/uL (ref 0.00–0.07)
Basophils Absolute: 0 10*3/uL (ref 0.0–0.1)
Basophils Relative: 0 %
Eosinophils Absolute: 0.1 10*3/uL (ref 0.0–0.5)
Eosinophils Relative: 1 %
HCT: 39.7 % (ref 36.0–46.0)
Hemoglobin: 13.2 g/dL (ref 12.0–15.0)
Immature Granulocytes: 0 %
Lymphocytes Relative: 26 %
Lymphs Abs: 1.3 10*3/uL (ref 0.7–4.0)
MCH: 28.3 pg (ref 26.0–34.0)
MCHC: 33.2 g/dL (ref 30.0–36.0)
MCV: 85 fL (ref 80.0–100.0)
Monocytes Absolute: 0.4 10*3/uL (ref 0.1–1.0)
Monocytes Relative: 8 %
Neutro Abs: 3.4 10*3/uL (ref 1.7–7.7)
Neutrophils Relative %: 65 %
Platelets: 290 10*3/uL (ref 150–400)
RBC: 4.67 MIL/uL (ref 3.87–5.11)
RDW: 12.4 % (ref 11.5–15.5)
WBC: 5.2 10*3/uL (ref 4.0–10.5)
nRBC: 0 % (ref 0.0–0.2)

## 2023-12-05 LAB — COMPREHENSIVE METABOLIC PANEL WITH GFR
ALT: 15 U/L (ref 0–44)
AST: 14 U/L — ABNORMAL LOW (ref 15–41)
Albumin: 3.8 g/dL (ref 3.5–5.0)
Alkaline Phosphatase: 76 U/L (ref 38–126)
Anion gap: 8 (ref 5–15)
BUN: 13 mg/dL (ref 8–23)
CO2: 24 mmol/L (ref 22–32)
Calcium: 9.7 mg/dL (ref 8.9–10.3)
Chloride: 102 mmol/L (ref 98–111)
Creatinine, Ser: 0.74 mg/dL (ref 0.44–1.00)
GFR, Estimated: >60 mL/min (ref >60)
Glucose, Bld: 236 mg/dL — ABNORMAL HIGH (ref 70–99)
Potassium: 3.4 mmol/L — ABNORMAL LOW (ref 3.5–5.1)
Sodium: 134 mmol/L — ABNORMAL LOW (ref 135–145)
Total Bilirubin: 0.6 mg/dL (ref 0.0–1.2)
Total Protein: 7.9 g/dL (ref 6.5–8.1)

## 2023-12-05 LAB — CK: Total CK: 183 U/L (ref 38–234)

## 2023-12-05 LAB — TROPONIN I (HIGH SENSITIVITY): Troponin I (High Sensitivity): 5 ng/L (ref <18)

## 2023-12-05 LAB — MAGNESIUM: Magnesium: 1.9 mg/dL (ref 1.7–2.4)

## 2023-12-05 LAB — TSH: TSH: 0.762 u[IU]/mL (ref 0.350–4.500)

## 2023-12-05 MED ORDER — IOHEXOL 350 MG/ML SOLN
125.0000 mL | Freq: Once | INTRAVENOUS | Status: AC | PRN
  Administered 2023-12-05: 125 mL via INTRAVENOUS

## 2023-12-05 MED ORDER — NAPROXEN 375 MG PO TABS: 375.0000 mg | ORAL_TABLET | Freq: Two times a day (BID) | ORAL | 0 refills | Status: AC

## 2023-12-05 MED ORDER — HYDROCODONE-ACETAMINOPHEN 5-325 MG PO TABS
1.0000 | ORAL_TABLET | Freq: Once | ORAL | Status: AC
  Administered 2023-12-05: 1 via ORAL
  Filled 2023-12-05: qty 1

## 2023-12-05 NOTE — ED Notes (Signed)
 Pt in CT.

## 2023-12-05 NOTE — ED Triage Notes (Signed)
 Pt arrived via POV c/o central chest pain with radiation to her neck. Pt reports pain has been on-going for a long time. Pt endorses Nausea, weakness and fatigue.

## 2023-12-05 NOTE — Discharge Instructions (Signed)
 Please follow-up closely with ENT on an outpatient basis for further evaluation of the thyroid  nodule.  You were provided with a copy of your CT scan report.  Please call to make an appointment with Dr. Donalee Fruits.  Please follow-up closely with your primary care doctor regarding your elevated blood sugar.  Return to the emergency department immediately for any new or worsening symptoms.  [facility] Primary Care Doctor List    Alberteen Huge, MD. Specialty: [facility] Medicine Contact information: [address]  [place] [address]  [phone]   Charlotta Cook, MD. Specialty: [facility] Medicine Contact information: [address]  [place] [address]  [phone]   Clary Crown, MD Specialty: Internal Medicine Contact information: [address] [place] [address]  [phone]   Lucendia Rusk, MD. Specialty: Internal Medicine Contact information: [address]  [place] [address]  [phone]    [facility] (Dr. Burdette Carolin) Specialty: Family Medicine Contact information: [address]  [place] [address]  [phone]   Darrall Ellison, MD. Specialty: [facility] Medicine Contact information: [address]  [address]  [place] [address]  [phone]   Artemisa Bile, MD. Specialty: Internal Medicine Contact information: [address]  [address]  [place] [address]  [phone]   [facility]: [address] [phone]  Selene Dais, Family medicine [address]  [phone]  [facility] [address] [place], [place] [phone]  [facility]: [address] [phone]    [facility]  [address] [place], [place] [address] [phone]  Services [facility] offers a variety of basic health services.  Services include but are not limited to: Blood pressure checks  Heart rate checks  Blood  sugar checks  Urine analysis  Rapid strep tests  Pregnancy tests.  Health education and referrals  People needing more complex services will be directed to a physician online. Using these virtual visits, doctors can evaluate and prescribe medicine and treatments. There will be no medication on-site, though [facility] will help patients fill their prescriptions at little to no cost.   For More information please go to: [email or web address]  Allergy and Asthma:    [address] [facility] [address] Selene Dais [phone]  Urology:  [address]  [place] [phone]  [facility]  [address] [place], [place] [phone]  Orthopedics   [address] [place], [place] [phone]  Endocrinology  [address] [place], [place] [phone]  Podiatry: [facility] [phone]

## 2023-12-05 NOTE — ED Provider Notes (Signed)
 [place] EMERGENCY DEPARTMENT AT [facility] Provider Note   CSN: [id number] Arrival date & time: 12/05/23  1049     History  Chief Complaint  Patient presents with   Chest Pain    [name] is a 68 y.o. female.  Patient is a 68 year old female who presents to the emergency department with a chief complaint of right-sided chest pain and right-sided neck pain which has been ongoing for months.  Patient notes that the symptoms have been intermittent in nature.  Patient does note that the pain became worse last night prompting her to come to the emergency department.  She has not followed up with her PCP regarding this pain as of yet.  She notes that she has had no associated abdominal pain, nausea, vomiting, diarrhea.  She does admit to some intermittent shortness of breath.  She has had no associated dizziness, lightheadedness or syncope.  She denies any abnormal headaches, numbness, paresthesias or unilateral weakness.  She denies any vision or hearing changes.   Chest Pain      Home Medications Prior to Admission medications   Medication Sig Start Date End Date Taking? Authorizing Provider  atorvastatin  (LIPITOR) 10 MG tablet Take 1 tablet (10 mg total) by mouth daily. 07/11/21 08/10/21  Caccavale, Sophia, PA-C  benzonatate  (TESSALON ) 100 MG capsule Take 1 capsule (100 mg total) by mouth 3 (three) times daily as needed for cough. Do not take with alcohol or while driving or operating heavy machinery.  May cause drowsiness. 11/27/22   Wilhemena Harbour, NP  cyclobenzaprine  (FLEXERIL ) 5 MG tablet Take 1 tablet (5 mg total) by mouth 3 (three) times daily as needed for muscle spasms. Patient not taking: Reported on 07/11/2021 08/30/17   Stephany Ehrich, MD  fluconazole  (DIFLUCAN ) 150 MG tablet Take one tablet at the onset of symptoms, if still having symptoms in 3 days, take the second tablet. Patient not taking: Reported on 07/11/2021 09/13/20   Wellington Half, FNP  glipiZIDE  (GLUCOTROL ) 5 MG tablet Take 0.5 tablets (2.5 mg total) by mouth daily before breakfast. 07/11/21   Caccavale, Sophia, PA-C  ibuprofen  (ADVIL ,MOTRIN ) 800 MG tablet Take 1 tablet (800 mg total) by mouth every 8 (eight) hours as needed for moderate pain. Patient not taking: Reported on 07/11/2021 08/30/17   Stephany Ehrich, MD  lisinopril  (ZESTRIL ) 5 MG tablet Take 1 tablet (5 mg total) by mouth daily. 07/11/21   Caccavale, Sophia, PA-C  metFORMIN  (GLUCOPHAGE -XR) 750 MG 24 hr tablet TAKE 1 TABLET BY MOUTH ONCE DAILY WITH BREAKFAST Patient not taking: Reported on 07/11/2021 10/31/17   Stephany Ehrich, MD  omeprazole  (PRILOSEC) 40 MG capsule Take 1 capsule (40 mg total) by mouth daily. Take in morning empty stomach Patient not taking: Reported on 07/11/2021 11/18/17   Stephany Ehrich, MD      Allergies    Patient has no known allergies.    Review of Systems   Review of Systems  Cardiovascular:  Positive for chest pain.  Musculoskeletal:  Positive for neck pain.  All other systems reviewed and are negative.   Physical Exam Updated Vital Signs BP (!) 142/79   Pulse 82   Temp 98.2 F (36.8 C) (Oral)   Resp 20   Ht 4\' 11"  (1.499 m)   Wt 86.2 kg   SpO2 100%   BMI 38.38 kg/m  Physical Exam Vitals and nursing note reviewed.  Constitutional:      Appearance: Normal appearance.  HENT:  Head: Normocephalic and atraumatic.     Nose: Nose normal.     Mouth/Throat:     Mouth: Mucous membranes are moist.  Eyes:     Extraocular Movements: Extraocular movements intact.     Conjunctiva/sclera: Conjunctivae normal.     Pupils: Pupils are equal, round, and reactive to light.  Neck:     Thyroid : No thyromegaly.     Vascular: No JVD.     Trachea: No tracheal deviation.     Comments: No carotid bruits Cardiovascular:     Rate and Rhythm: Normal rate and regular rhythm.     Pulses: Normal pulses.     Heart sounds: Normal heart sounds. Heart sounds not  distant. No murmur heard. Pulmonary:     Effort: Pulmonary effort is normal. No tachypnea or respiratory distress.     Breath sounds: Normal breath sounds. No stridor. No decreased breath sounds, wheezing, rhonchi or rales.  Chest:     Chest wall: No mass, tenderness or edema.  Abdominal:     General: Abdomen is flat. Bowel sounds are normal.     Palpations: Abdomen is soft. There is no mass.     Tenderness: There is no abdominal tenderness. There is no guarding.  Musculoskeletal:        General: Normal range of motion.     Cervical back: Normal range of motion and neck supple.     Right lower leg: No edema.     Left lower leg: No edema.  Skin:    General: Skin is warm and dry.  Neurological:     General: No focal deficit present.     Mental Status: She is alert and oriented to person, place, and time. Mental status is at baseline.  Psychiatric:        Mood and Affect: Mood normal.        Behavior: Behavior normal.        Thought Content: Thought content normal.        Judgment: Judgment normal.     ED Results / Procedures / Treatments   Labs (all labs ordered are listed, but only abnormal results are displayed) Labs Reviewed  COMPREHENSIVE METABOLIC PANEL WITH GFR - Abnormal; Notable for the following components:      Result Value   Sodium 134 (*)    Potassium 3.4 (*)    Glucose, Bld 236 (*)    AST 14 (*)    All other components within normal limits  CBC WITH DIFFERENTIAL/PLATELET  MAGNESIUM  CK  TROPONIN I (HIGH SENSITIVITY)    EKG None  Radiology DG Chest 2 View Result Date: 12/05/2023 CLINICAL DATA:  Chest pain. EXAM: CHEST - 2 VIEW COMPARISON:  07/11/2021. FINDINGS: Low lung volume. Bilateral lung fields are clear. Bilateral costophrenic angles are clear. Stable cardio-mediastinal silhouette. No acute osseous abnormalities. The soft tissues are within normal limits. IMPRESSION: No active cardiopulmonary disease. Electronically Signed   By: Beula Brunswick M.D.    On: 12/05/2023 11:37    Procedures Procedures    Medications Ordered in ED Medications  HYDROcodone -acetaminophen  (NORCO/VICODIN) 5-325 MG per tablet 1 tablet (1 tablet Oral Given 12/05/23 1146)    ED Course/ Medical Decision Making/ A&P                                 Medical Decision Making Amount and/or Complexity of Data Reviewed Labs: ordered. Radiology: ordered.  Risk Prescription drug management.  This patient presents to the ED for concern of right-sided neck pain, right-sided chest pain differential diagnosis includes ACS, thyroid  nodule, pulmonary embolus, pneumothorax, hemothorax, carotid artery occlusion, vertebral artery occlusion, pneumonia    Additional history obtained:  Additional history obtained from none External records from outside source obtained and reviewed including none   Lab Tests:  I Ordered, and personally interpreted labs.  The pertinent results include: No leukocytosis, no anemia, normal electrolytes, hyperglycemia, normal kidney function liver function, negative troponin, negative CK, normal magnesium   Imaging Studies ordered:  I ordered imaging studies including CT scan of neck and chest I independently visualized and interpreted imaging which showed no acute cardiopulmonary process, no pulmonary embolus, right-sided thyroid  nodule I agree with the radiologist interpretation   Medicines ordered and prescription drug management:  I ordered medication including Norco for acute pain Reevaluation of the patient after these medicines showed that the patient improved I have reviewed the patients home medicines and have made adjustments as needed   Problem List / ED Course:  Patient is doing well at this time and is stable for discharge home.  Discussed with patient that CT scan findings are consistent with a right sided thyroid  nodule.  Discussed with patient about the importance of close follow-up with ENT on an outpatient basis for  ultrasound and possible biopsy.  Patient has no signs of any airway occlusion at this point.  She had no stenosis noted on CTA of the neck.  She had no pulmonary embolus and no acute cardiopulmonary process noted on CTA of the chest.  Blood work has been unremarkable at this point.  Patient was provided with a copy of her CT scan report.  Importance of close follow-up was discussed as well as strict turn precautions for any new or worsening symptoms.  Patient voiced understanding to the plan and had no additional questions.   Social Determinants of Health:  None           Final Clinical Impression(s) / ED Diagnoses Final diagnoses:  None    Rx / DC Orders ED Discharge Orders     None         Roselynn Connors, PA-C 12/05/23 1418    Jerilynn Montenegro, MD 12/07/23 2228

## 2023-12-05 NOTE — ED Notes (Signed)
 Pt c/o sharp pains to right side of neck radiating into right and left chest for "months". Admits to not f/u with pcp bc she doesn't want to drive to [place]. Pt c/o being shob sometimes as well and at present. No resp distress or shob noted at this time. Color wnl. Non diaphoretic. Nad. When asked about dizziness/nausea/sweating pt states "yes, I have that too". Also c/o being tired all the time

## 2024-02-22 ENCOUNTER — Other Ambulatory Visit: Payer: Self-pay

## 2024-02-22 ENCOUNTER — Emergency Department (HOSPITAL_COMMUNITY)

## 2024-02-22 ENCOUNTER — Emergency Department (HOSPITAL_COMMUNITY)
Admission: EM | Admit: 2024-02-22 | Discharge: 2024-02-22 | Disposition: A | Discharge status: Home / Self Care | Attending: Emergency Medicine | Admitting: Emergency Medicine

## 2024-02-22 DIAGNOSIS — D72819 Decreased white blood cell count, unspecified: Secondary | ICD-10-CM | POA: Insufficient documentation

## 2024-02-22 DIAGNOSIS — R0789 Other chest pain: Secondary | ICD-10-CM | POA: Insufficient documentation

## 2024-02-22 DIAGNOSIS — E041 Nontoxic single thyroid nodule: Secondary | ICD-10-CM

## 2024-02-22 DIAGNOSIS — R509 Fever, unspecified: Secondary | ICD-10-CM | POA: Diagnosis present

## 2024-02-22 DIAGNOSIS — I10 Essential (primary) hypertension: Secondary | ICD-10-CM | POA: Diagnosis not present

## 2024-02-22 DIAGNOSIS — E119 Type 2 diabetes mellitus without complications: Secondary | ICD-10-CM | POA: Diagnosis not present

## 2024-02-22 DIAGNOSIS — R6889 Other general symptoms and signs: Secondary | ICD-10-CM

## 2024-02-22 DIAGNOSIS — Z79899 Other long term (current) drug therapy: Secondary | ICD-10-CM | POA: Diagnosis not present

## 2024-02-22 DIAGNOSIS — R350 Frequency of micturition: Secondary | ICD-10-CM | POA: Insufficient documentation

## 2024-02-22 DIAGNOSIS — Z7984 Long term (current) use of oral hypoglycemic drugs: Secondary | ICD-10-CM | POA: Diagnosis not present

## 2024-02-22 DIAGNOSIS — N644 Mastodynia: Secondary | ICD-10-CM | POA: Diagnosis not present

## 2024-02-22 LAB — URINALYSIS, ROUTINE W REFLEX MICROSCOPIC
Bacteria, UA: NONE SEEN
Bilirubin Urine: NEGATIVE
Glucose, UA: 50 mg/dL — AB
Hgb urine dipstick: NEGATIVE
Ketones, ur: NEGATIVE mg/dL
Nitrite: NEGATIVE
Protein, ur: NEGATIVE mg/dL
Specific Gravity, Urine: 1.01 (ref 1.005–1.030)
pH: 7 (ref 5.0–8.0)

## 2024-02-22 LAB — COMPREHENSIVE METABOLIC PANEL WITH GFR
ALT: 19 U/L (ref 0–44)
AST: 26 U/L (ref 15–41)
Albumin: 3.3 g/dL — ABNORMAL LOW (ref 3.5–5.0)
Alkaline Phosphatase: 56 U/L (ref 38–126)
Anion gap: 9 (ref 5–15)
BUN: 9 mg/dL (ref 8–23)
CO2: 27 mmol/L (ref 22–32)
Calcium: 9 mg/dL (ref 8.9–10.3)
Chloride: 103 mmol/L (ref 98–111)
Creatinine, Ser: 1.01 mg/dL — ABNORMAL HIGH (ref 0.44–1.00)
GFR, Estimated: >60 mL/min (ref >60)
Glucose, Bld: 155 mg/dL — ABNORMAL HIGH (ref 70–99)
Potassium: 3.3 mmol/L — ABNORMAL LOW (ref 3.5–5.1)
Sodium: 139 mmol/L (ref 135–145)
Total Bilirubin: 0.6 mg/dL (ref 0.0–1.2)
Total Protein: 7.3 g/dL (ref 6.5–8.1)

## 2024-02-22 LAB — CBC WITH DIFFERENTIAL/PLATELET
Abs Immature Granulocytes: 0.02 K/uL (ref 0.00–0.07)
Basophils Absolute: 0 K/uL (ref 0.0–0.1)
Basophils Relative: 0 %
Eosinophils Absolute: 0 K/uL (ref 0.0–0.5)
Eosinophils Relative: 0 %
HCT: 37.8 % (ref 36.0–46.0)
Hemoglobin: 12.5 g/dL (ref 12.0–15.0)
Immature Granulocytes: 1 %
Lymphocytes Relative: 10 %
Lymphs Abs: 0.4 K/uL — ABNORMAL LOW (ref 0.7–4.0)
MCH: 28 pg (ref 26.0–34.0)
MCHC: 33.1 g/dL (ref 30.0–36.0)
MCV: 84.8 fL (ref 80.0–100.0)
Monocytes Absolute: 0.4 K/uL (ref 0.1–1.0)
Monocytes Relative: 12 %
Neutro Abs: 2.9 K/uL (ref 1.7–7.7)
Neutrophils Relative %: 77 %
Platelets: 186 K/uL (ref 150–400)
RBC: 4.46 MIL/uL (ref 3.87–5.11)
RDW: 13.2 % (ref 11.5–15.5)
WBC: 3.8 K/uL — ABNORMAL LOW (ref 4.0–10.5)
nRBC: 0 % (ref 0.0–0.2)

## 2024-02-22 LAB — I-STAT CG4 LACTIC ACID, ED: Lactic Acid, Venous: 1.2 mmol/L (ref 0.5–1.9)

## 2024-02-22 LAB — RESP PANEL BY RT-PCR (RSV, FLU A&B, COVID)  RVPGX2
Influenza A by PCR: NEGATIVE
Influenza B by PCR: NEGATIVE
Resp Syncytial Virus by PCR: NEGATIVE
SARS Coronavirus 2 by RT PCR: NEGATIVE

## 2024-02-22 LAB — CBG MONITORING, ED: Glucose-Capillary: 162 mg/dL — ABNORMAL HIGH (ref 70–99)

## 2024-02-22 LAB — TROPONIN I (HIGH SENSITIVITY): Troponin I (High Sensitivity): 10 ng/L (ref <18)

## 2024-02-22 MED ORDER — ACETAMINOPHEN 500 MG PO TABS
1000.0000 mg | ORAL_TABLET | Freq: Once | ORAL | Status: AC
  Administered 2024-02-22: 1000 mg via ORAL
  Filled 2024-02-22: qty 2

## 2024-02-22 MED ORDER — IOHEXOL 300 MG/ML  SOLN
75.0000 mL | Freq: Once | INTRAMUSCULAR | Status: AC | PRN
  Administered 2024-02-22: 75 mL via INTRAVENOUS

## 2024-02-22 NOTE — Discharge Instructions (Addendum)
 Take care strength Tylenol  2 tablets every 8 hours.  Very important that you follow-up about the right thyroid  nodule.  This most likely needs repeat ultrasound probably needs biopsy.  Give information about following up with wellness clinic if that would help you in the meantime.  Call and set up an appointment with them.  Return for any new or worse symptoms.  We suspect this is a viral infection urinalysis was normal no infection there.  No evidence of pneumonia.  Screening test for COVID flu and RSV was negative.  But something is causing the fever most likely a viral illness.

## 2024-02-22 NOTE — ED Triage Notes (Addendum)
 Patient to ED by POV with c/o generalized body aches. She c/o night chills, states she has HX of HTN and diabetes. Voices urinating more often and migraines.  Patient c/o pain in both breast more so in the left and voices depression.

## 2024-02-22 NOTE — ED Provider Notes (Signed)
 [place] [facility] AT [facility] Provider Note   CSN: [id number] Arrival date & time: 02/22/24  1524     Patient presents with: Generalized Body Aches   [name] is a 68 y.o. female.   Patient here with multiple complaints but 1 is definitely fever generalized body aches night chills increased urinary frequency.  Pain more so on the left side of the chest left breast area.  Patient is on metformin  and patient is on lisinopril  for hypertension.  Has a known history of hypertension diabetes.  States she does not currently have a primary care doctor.  Past medical history notes that hypertension diabetes depression hyperlipidemia.  Patient is never used tobacco products.  No one else sick at home       Prior to Admission medications   Medication Sig Start Date End Date Taking? Authorizing Provider  atorvastatin  (LIPITOR) 10 MG tablet Take 1 tablet (10 mg total) by mouth daily. 07/11/21 08/10/21  Caccavale, Sophia, PA-C  benzonatate  (TESSALON ) 100 MG capsule Take 1 capsule (100 mg total) by mouth 3 (three) times daily as needed for cough. Do not take with alcohol or while driving or operating heavy machinery.  May cause drowsiness. 11/27/22   Chandra Harlene LABOR, NP  cyclobenzaprine  (FLEXERIL ) 5 MG tablet Take 1 tablet (5 mg total) by mouth 3 (three) times daily as needed for muscle spasms. Patient not taking: Reported on 07/11/2021 08/30/17   Maranda Jamee Jacob, MD  fluconazole  (DIFLUCAN ) 150 MG tablet Take one tablet at the onset of symptoms, if still having symptoms in 3 days, take the second tablet. Patient not taking: Reported on 07/11/2021 09/13/20   Alvia Corean CROME, FNP  glipiZIDE  (GLUCOTROL ) 5 MG tablet Take 0.5 tablets (2.5 mg total) by mouth daily before breakfast. 07/11/21   Caccavale, Sophia, PA-C  ibuprofen  (ADVIL ,MOTRIN ) 800 MG tablet Take 1 tablet (800 mg total) by mouth every 8 (eight) hours as needed for moderate pain. Patient not  taking: Reported on 07/11/2021 08/30/17   Maranda Jamee Jacob, MD  lisinopril  (ZESTRIL ) 5 MG tablet Take 1 tablet (5 mg total) by mouth daily. 07/11/21   Caccavale, Sophia, PA-C  metFORMIN  (GLUCOPHAGE -XR) 750 MG 24 hr tablet TAKE 1 TABLET BY MOUTH ONCE DAILY WITH BREAKFAST Patient not taking: Reported on 07/11/2021 10/31/17   Maranda Jamee Jacob, MD  naproxen  (NAPROSYN ) 375 MG tablet Take 1 tablet (375 mg total) by mouth 2 (two) times daily. 12/05/23   Daralene Lonni BIRCH, PA-C  omeprazole  (PRILOSEC) 40 MG capsule Take 1 capsule (40 mg total) by mouth daily. Take in morning empty stomach Patient not taking: Reported on 07/11/2021 11/18/17   Maranda Jamee Jacob, MD    Allergies: Patient has no known allergies.    Review of Systems  Constitutional:  Positive for chills and fever.  HENT:  Positive for congestion. Negative for ear pain and sore throat.   Eyes:  Negative for pain and visual disturbance.  Respiratory:  Positive for cough. Negative for shortness of breath.   Cardiovascular:  Positive for chest pain. Negative for palpitations and leg swelling.  Gastrointestinal:  Negative for abdominal pain, nausea and vomiting.  Genitourinary:  Positive for frequency. Negative for dysuria and hematuria.  Musculoskeletal:  Positive for myalgias. Negative for arthralgias and back pain.  Skin:  Negative for color change and rash.  Neurological:  Negative for seizures, syncope and headaches.  All other systems reviewed and are negative.   Updated Vital Signs BP 133/80   Pulse  93   Temp (!) 101 F (38.3 C) (Oral)   Resp 16   Ht 1.524 m (5')   Wt 87 kg   SpO2 95%   BMI 37.46 kg/m   Physical Exam Vitals and nursing note reviewed.  Constitutional:      General: She is not in acute distress.    Appearance: Normal appearance. She is well-developed.  HENT:     Head: Normocephalic and atraumatic.  Eyes:     Extraocular Movements: Extraocular movements intact.     Conjunctiva/sclera: Conjunctivae  normal.     Pupils: Pupils are equal, round, and reactive to light.  Cardiovascular:     Rate and Rhythm: Normal rate and regular rhythm.     Heart sounds: No murmur heard. Pulmonary:     Effort: Pulmonary effort is normal. No respiratory distress.     Breath sounds: Normal breath sounds.  Abdominal:     Palpations: Abdomen is soft.     Tenderness: There is no abdominal tenderness.  Musculoskeletal:        General: No swelling.     Cervical back: Normal range of motion and neck supple. No rigidity.     Right lower leg: No edema.     Left lower leg: No edema.  Skin:    General: Skin is warm and dry.     Capillary Refill: Capillary refill takes less than 2 seconds.  Neurological:     General: No focal deficit present.     Mental Status: She is alert and oriented to person, place, and time.  Psychiatric:        Mood and Affect: Mood normal.     (all labs ordered are listed, but only abnormal results are displayed) Labs Reviewed  CBC WITH DIFFERENTIAL/PLATELET - Abnormal; Notable for the following components:      Result Value   WBC 3.8 (*)    Lymphs Abs 0.4 (*)    All other components within normal limits  COMPREHENSIVE METABOLIC PANEL WITH GFR - Abnormal; Notable for the following components:   Potassium 3.3 (*)    Glucose, Bld 155 (*)    Creatinine, Ser 1.01 (*)    Albumin 3.3 (*)    All other components within normal limits  URINALYSIS, ROUTINE W REFLEX MICROSCOPIC - Abnormal; Notable for the following components:   Glucose, UA 50 (*)    Leukocytes,Ua TRACE (*)    All other components within normal limits  CBG MONITORING, ED - Abnormal; Notable for the following components:   Glucose-Capillary 162 (*)    All other components within normal limits  RESP PANEL BY RT-PCR (RSV, FLU A&B, COVID)  RVPGX2  CULTURE, BLOOD (ROUTINE X 2)  CULTURE, BLOOD (ROUTINE X 2)  I-STAT CG4 LACTIC ACID, ED  TROPONIN I (HIGH SENSITIVITY)    EKG: EKG Interpretation Date/Time:  Saturday  February 22 2024 15:59:44 EDT Ventricular Rate:  79 PR Interval:  147 QRS Duration:  83 QT Interval:  334 QTC Calculation: 383 R Axis:   39  Text Interpretation: Sinus rhythm Borderline T wave abnormalities No significant change since last tracing Confirmed by Deaundre Allston [phone]) on 02/22/2024 4:06:56 PM  Radiology: CT Chest W Contrast Result Date: 02/22/2024 CLINICAL DATA:  Body aches, night chills.  History of diabetes. EXAM: CT CHEST WITH CONTRAST TECHNIQUE: Multidetector CT imaging of the chest was performed during intravenous contrast administration. RADIATION DOSE REDUCTION: This exam was performed according to the departmental dose-optimization program which includes automated exposure control, adjustment of  the mA and/or kV according to patient size and/or use of iterative reconstruction technique. CONTRAST:  75mL OMNIPAQUE  IOHEXOL  300 MG/ML  SOLN COMPARISON:  12/05/2023 FINDINGS: Cardiovascular: Unremarkable Mediastinum/Nodes: 3.0 cm right thyroid  nodule. This was investigated on the thyroid  ultrasound of 08/23/2021 and met criteria for biopsy at that time. This has been evaluated on previous imaging. (ref: J Am Coll Radiol. 2015 Feb;12(2): 143-50). Left lower paratracheal lymph node 0.8 cm in short axis on image 47 series 2. This measures 1.0 cm on 07/2217 and 0.8 cm on 12/05/2023. Lungs/Pleura: 4 mm posterior basal segment right lower lobe nodule on image 99 series 5 is unchanged from 08/20/2017 and considered benign. Stable 2 by 3 mm right middle lobe nodule on image 70 series 5, considered benign. Upper Abdomen: Contracted gallbladder. Chronic low-density fullness of the adrenal glands. Musculoskeletal: Thoracic spondylosis. IMPRESSION: 1. No specific abnormality is identified to explain the patient's symptoms. 2. Chronic low-density fullness of the adrenal glands. 3. Thoracic spondylosis. 4. 3.0 cm right thyroid  nodule. This was investigated on the thyroid  ultrasound of 08/23/2021. This nodule met  criteria for biopsy at that time, and has further enlarged since then. If this was not previously biopsied, thin repeat non emergent thyroid  ultrasound and biopsy is recommended. Electronically Signed   By: Ryan Salvage M.D.   On: 02/22/2024 19:36   DG Chest Port 1 View Result Date: 02/22/2024 CLINICAL DATA:  Chest pain. EXAM: PORTABLE CHEST 1 VIEW COMPARISON:  Chest radiograph dated 12/05/2023. FINDINGS: No focal consolidation, pleural effusion or pneumothorax. Mild cardiomegaly. No acute osseous pathology. IMPRESSION: 1. No active disease. 2. Mild cardiomegaly. Electronically Signed   By: Vanetta Chou M.D.   On: 02/22/2024 16:35     Procedures   Medications Ordered in the ED  acetaminophen  (TYLENOL ) tablet 1,000 mg (1,000 mg Oral Given 02/22/24 1830)  iohexol  (OMNIPAQUE ) 300 MG/ML solution 75 mL (75 mLs Intravenous Contrast Given 02/22/24 1907)                                    Medical Decision Making Amount and/or Complexity of Data Reviewed Labs: ordered. Radiology: ordered.  Risk OTC drugs. Prescription drug management.   Vital signs significant for temp of 102.8.  Heart rate 85 blood pressure 178/88 oxygen saturation 98% on room air.  Knee do investigate source of fever.  Patient nontoxic no acute distress based on vital signs does not meet sepsis criteria at this point in time.  Will get blood cultures will get lactic acid will get CBC with differential looking complete metabolic panel will get troponins and will get chest x-ray and urinalysis.  As well as respiratory panel to rule out COVID influenza RSV.  Respiratory panel negative.  Lactic acid reassuring at 1.2.  Urinalysis negative for urinary tract infection.  Blood cultures were sent this troponins 10 do not need delta troponin.  CBC white count 3.8 suggestive of maybe a viral infection hemoglobin 12.5 platelets 186.  Complete metabolic panel significant for potassium down a little bit at 3.2, creatinine 1.01  LFTs normal.  Anion gap normal.  Chest x-ray without any acute findings follow that up with CT chest could be to have an explanation for the fever.  Nothing specifically found.  However there is a persistent now 3 cm right thyroid  nodule that was investigated on the thyroid  ultrasound in January 2023.  Met criteria for biopsy at that time has enlarged further.  Do not think patient had a biopsy in conversation with her.  She will follow-up with that.  Patient most likely has a viral process.  Nothing acute stable for discharge they will get her into a primary care doctor to have the follow-up of the thyroid  nodule on the right side  Will also have her continue with Exer strength Tylenol  500 mg every 8 hours for the fever and the flulike symptoms.      Final diagnoses:  Flu-like symptoms  Fever, unspecified fever cause    ED Discharge Orders     None          Geraldene Hamilton, MD 02/22/24 2053

## 2024-02-23 LAB — BLOOD CULTURE ID PANEL (REFLEXED) - BCID2

## 2024-02-24 LAB — CULTURE, BLOOD (ROUTINE X 2): Special Requests: ADEQUATE

## 2024-02-25 ENCOUNTER — Telehealth (HOSPITAL_BASED_OUTPATIENT_CLINIC_OR_DEPARTMENT_OTHER): Payer: Self-pay | Admitting: *Deleted

## 2024-02-25 NOTE — Telephone Encounter (Signed)
 Post ED Visit - Positive Culture Follow-up  Culture report reviewed by antimicrobial stewardship pharmacist: [facility] []  Rankin Dee, Pharm.D. []  Venetia Gully, [address].D., BCPS AQ-ID []  Garrel Crews, Pharm.D., BCPS [x]  Almarie Lunger, [address].D., BCPS []  [place], [place].D., BCPS, AAHIVP []  Rosaline Bihari, Pharm.D., BCPS, AAHIVP []  Vernell Meier, PharmD, BCPS []  Latanya Hint, PharmD, BCPS []  Donald Medley, PharmD, BCPS []  Rocky Bold, PharmD []  Dorothyann Alert, PharmD, BCPS []  Morene Babe, PharmD  [facility] []  Rosaline Edison, PharmD []  Romona Bliss, PharmD []  Dolphus Roller, PharmD []  Veva Seip, Rph []  Vernell Daunt) Leonce, PharmD []  Eva Allis, PharmD []  Rosaline Millet, PharmD []  Iantha Batch, PharmD []  Arvin Gauss, PharmD []  Wanda Hasting, PharmD []  Ronal Rav, PharmD []  Rocky Slade, PharmD []  Bard Jeans, PharmD   Positive blood culture Presented w/ generalized body aches/fever.  Nontoxic, not septic; presumed viral.  Only 1 set of blood cx drawn--presumed contamination.  No treatment or patient follow-up is required at this time.  Lorita Barnie Pereyra 02/25/2024, 8:46 AM

## 2024-02-25 NOTE — Progress Notes (Signed)
 ED Antimicrobial Stewardship Positive Culture Follow Up   [name] is an 68 y.o. female who presented to [facility] on 02/22/2024 with a chief complaint of  Chief Complaint  Patient presents with   Generalized Body Aches    Recent Results (from the past 720 hours)  Culture, blood (Routine X 2) w Reflex to ID Panel     Status: Abnormal   Collection Time: 02/22/24  4:38 PM   Specimen: BLOOD  Result Value Ref Range Status   Specimen Description   Final    BLOOD LEFT ANTECUBITAL Performed at [facility], [address] [place], [place] [address]    Special Requests   Final    BOTTLES DRAWN AEROBIC AND ANAEROBIC Blood Culture adequate volume Performed at [facility], [address] [place], [place] [address]    Culture  Setup Time   Final    GRAM POSITIVE COCCI AEROBIC BOTTLE ONLY CRITICAL RESULT CALLED TO, READ BACK BY AND VERIFIED WITH: [facility] ED CHARGE RN JUDITHANN BLALOCK [phone] AT 1929, ADC    Culture (A)  Final    STAPHYLOCOCCUS EPIDERMIDIS STAPHYLOCOCCUS HOMINIS THE SIGNIFICANCE OF ISOLATING THIS ORGANISM FROM A SINGLE SET OF BLOOD CULTURES WHEN MULTIPLE SETS ARE DRAWN IS UNCERTAIN. PLEASE NOTIFY THE MICROBIOLOGY DEPARTMENT WITHIN ONE WEEK IF SPECIATION AND SENSITIVITIES ARE REQUIRED. Performed at [facility], [address] [place], [place] [address]    Report Status 02/24/2024 FINAL  Final  Blood Culture ID Panel (Reflexed)     Status: Abnormal   Collection Time: 02/22/24  4:38 PM  Result Value Ref Range Status   Enterococcus faecalis NOT DETECTED NOT DETECTED Final   Enterococcus Faecium NOT DETECTED NOT DETECTED Final   Listeria monocytogenes NOT DETECTED NOT DETECTED Final   Staphylococcus species DETECTED (A) NOT DETECTED Final    Comment: CRITICAL RESULT CALLED TO, READ BACK BY AND VERIFIED WITH: [facility] ED CHARGE RN JUDITHANN BLALOCK [phone] AT 1929, ADC    Staphylococcus aureus (BCID) NOT DETECTED NOT DETECTED Final    Staphylococcus epidermidis DETECTED (A) NOT DETECTED Final    Comment: CRITICAL RESULT CALLED TO, READ BACK BY AND VERIFIED WITH: [facility] ED CHARGE RN JUDITHANN BLALOCK [phone] AT 1929, ADC    Staphylococcus lugdunensis NOT DETECTED NOT DETECTED Final   Streptococcus species NOT DETECTED NOT DETECTED Final   Streptococcus agalactiae NOT DETECTED NOT DETECTED Final   Streptococcus pneumoniae NOT DETECTED NOT DETECTED Final   Streptococcus pyogenes NOT DETECTED NOT DETECTED Final   A.calcoaceticus-baumannii NOT DETECTED NOT DETECTED Final   Bacteroides fragilis NOT DETECTED NOT DETECTED Final   Enterobacterales NOT DETECTED NOT DETECTED Final   Enterobacter cloacae complex NOT DETECTED NOT DETECTED Final   Escherichia coli NOT DETECTED NOT DETECTED Final   Klebsiella aerogenes NOT DETECTED NOT DETECTED Final   Klebsiella oxytoca NOT DETECTED NOT DETECTED Final   Klebsiella pneumoniae NOT DETECTED NOT DETECTED Final   Proteus species NOT DETECTED NOT DETECTED Final   Salmonella species NOT DETECTED NOT DETECTED Final   Serratia marcescens NOT DETECTED NOT DETECTED Final   Haemophilus influenzae NOT DETECTED NOT DETECTED Final   Neisseria meningitidis NOT DETECTED NOT DETECTED Final   Pseudomonas aeruginosa NOT DETECTED NOT DETECTED Final   Stenotrophomonas maltophilia NOT DETECTED NOT DETECTED Final   Candida albicans NOT DETECTED NOT DETECTED Final   Candida auris NOT DETECTED NOT DETECTED Final   Candida glabrata NOT DETECTED NOT DETECTED Final   Candida krusei NOT DETECTED NOT DETECTED  Final   Candida parapsilosis NOT DETECTED NOT DETECTED Final   Candida tropicalis NOT DETECTED NOT DETECTED Final   Cryptococcus neoformans/gattii NOT DETECTED NOT DETECTED Final   Methicillin resistance mecA/C NOT DETECTED NOT DETECTED Final    Comment: Performed at [facility], [address], [place], [place] [address]  Resp panel by RT-PCR (RSV, Flu A&B, Covid) Anterior Nasal Swab     Status: None    Collection Time: 02/22/24  4:40 PM   Specimen: Anterior Nasal Swab  Result Value Ref Range Status   SARS Coronavirus 2 by RT PCR NEGATIVE NEGATIVE Final    Comment: (NOTE) SARS-CoV-2 target nucleic acids are NOT DETECTED.  The SARS-CoV-2 RNA is generally detectable in upper respiratory specimens during the acute phase of infection. The lowest concentration of SARS-CoV-2 viral copies this assay can detect is 138 copies/mL. A negative result does not preclude SARS-Cov-2 infection and should not be used as the sole basis for treatment or other patient management decisions. A negative result may occur with  improper specimen collection/handling, submission of specimen other than nasopharyngeal swab, presence of viral mutation(s) within the areas targeted by this assay, and inadequate number of viral copies(<138 copies/mL). A negative result must be combined with clinical observations, patient history, and epidemiological information. The expected result is Negative.  Fact Sheet for Patients:  BloggerCourse.com  Fact Sheet for Healthcare Providers:  SeriousBroker.it  This test is no t yet approved or cleared by the [place]  FDA and  has been authorized for detection and/or diagnosis of SARS-CoV-2 by FDA under an Emergency Use Authorization (EUA). This EUA will remain  in effect (meaning this test can be used) for the duration of the COVID-19 declaration under Section 564(b)(1) of the Act, 21 U.S.C.section 360bbb-3(b)(1), unless the authorization is terminated  or revoked sooner.       Influenza A by PCR NEGATIVE NEGATIVE Final   Influenza B by PCR NEGATIVE NEGATIVE Final    Comment: (NOTE) The Xpert Xpress SARS-CoV-2/FLU/RSV plus assay is intended as an aid in the diagnosis of influenza from Nasopharyngeal swab specimens and should not be used as a sole basis for treatment. Nasal washings and aspirates are unacceptable for  Xpert Xpress SARS-CoV-2/FLU/RSV testing.  Fact Sheet for Patients: BloggerCourse.com  Fact Sheet for Healthcare Providers: SeriousBroker.it  This test is not yet approved or cleared by the [place]  FDA and has been authorized for detection and/or diagnosis of SARS-CoV-2 by FDA under an Emergency Use Authorization (EUA). This EUA will remain in effect (meaning this test can be used) for the duration of the COVID-19 declaration under Section 564(b)(1) of the Act, 21 U.S.C. section 360bbb-3(b)(1), unless the authorization is terminated or revoked.     Resp Syncytial Virus by PCR NEGATIVE NEGATIVE Final    Comment: (NOTE) Fact Sheet for Patients: BloggerCourse.com  Fact Sheet for Healthcare Providers: SeriousBroker.it  This test is not yet approved or cleared by the [place]  FDA and has been authorized for detection and/or diagnosis of SARS-CoV-2 by FDA under an Emergency Use Authorization (EUA). This EUA will remain in effect (meaning this test can be used) for the duration of the COVID-19 declaration under Section 564(b)(1) of the Act, 21 U.S.C. section 360bbb-3(b)(1), unless the authorization is terminated or revoked.  Performed at [facility], [address] [place], [place] [address]     [x]  No treatment indicated  38 YOF who presented on 7/26 with generalized body aches, increased urinary frequency. UA negative, nontoxic  or septic, presumed viral and discharged on tylenol  for fever/aches. Only 1 set of blood cultures obtained which is showing two staph species - epi and hominis - presumably contaminants.  No treatment or follow-up indicated at this time  ED Provider: Fonda Ruby, PA-C  Thank you for allowing pharmacy to be a part of this patient's care.  Almarie Lunger, PharmD, BCPS, BCIDP Infectious Diseases Clinical  Pharmacist 02/25/2024 8:19 AM   **Pharmacist phone directory can now be found on [email or web address] ([email or web address]).  Listed under [facility].

## 2024-03-16 ENCOUNTER — Ambulatory Visit: Admitting: Family Medicine

## 2024-03-16 ENCOUNTER — Encounter: Payer: Self-pay | Admitting: Family Medicine

## 2024-03-16 VITALS — BP 154/82 | HR 74 | Temp 98.8°F | Ht 60.0 in | Wt 179.4 lb

## 2024-03-16 DIAGNOSIS — F321 Major depressive disorder, single episode, moderate: Secondary | ICD-10-CM | POA: Diagnosis not present

## 2024-03-16 DIAGNOSIS — E041 Nontoxic single thyroid nodule: Secondary | ICD-10-CM

## 2024-03-16 DIAGNOSIS — Z7689 Persons encountering health services in other specified circumstances: Secondary | ICD-10-CM

## 2024-03-16 DIAGNOSIS — Z1211 Encounter for screening for malignant neoplasm of colon: Secondary | ICD-10-CM

## 2024-03-16 DIAGNOSIS — Z1231 Encounter for screening mammogram for malignant neoplasm of breast: Secondary | ICD-10-CM

## 2024-03-16 DIAGNOSIS — Z23 Encounter for immunization: Secondary | ICD-10-CM | POA: Diagnosis not present

## 2024-03-16 DIAGNOSIS — E1165 Type 2 diabetes mellitus with hyperglycemia: Secondary | ICD-10-CM | POA: Diagnosis not present

## 2024-03-16 DIAGNOSIS — E785 Hyperlipidemia, unspecified: Secondary | ICD-10-CM

## 2024-03-16 DIAGNOSIS — R3915 Urgency of urination: Secondary | ICD-10-CM

## 2024-03-16 DIAGNOSIS — I10 Essential (primary) hypertension: Secondary | ICD-10-CM

## 2024-03-16 MED ORDER — SEMAGLUTIDE(0.25 OR 0.5MG/DOS) 2 MG/3ML ~~LOC~~ SOPN: PEN_INJECTOR | SUBCUTANEOUS | 1 refills | Status: DC

## 2024-03-16 MED ORDER — SOLIFENACIN SUCCINATE 5 MG PO TABS: 5.0000 mg | ORAL_TABLET | Freq: Every day | ORAL | 3 refills | Status: DC

## 2024-03-16 MED ORDER — METFORMIN HCL ER 750 MG PO TB24
750.0000 mg | ORAL_TABLET | Freq: Every day | ORAL | 1 refills | Status: AC
Start: 2024-03-16 — End: ?

## 2024-03-16 MED ORDER — LISINOPRIL 5 MG PO TABS: 5.0000 mg | ORAL_TABLET | Freq: Every day | ORAL | 1 refills | Status: DC

## 2024-03-16 MED ORDER — ROSUVASTATIN CALCIUM 20 MG PO TABS: 20.0000 mg | ORAL_TABLET | Freq: Every day | ORAL | 1 refills | Status: DC

## 2024-03-16 MED ORDER — ESCITALOPRAM OXALATE 10 MG PO TABS: 10.0000 mg | ORAL_TABLET | Freq: Every day | ORAL | 1 refills | Status: DC

## 2024-03-16 NOTE — Progress Notes (Signed)
 Patient Office Visit  Assessment & Plan:  Encounter to establish care  Hypertension, unspecified type -     CBC with Differential/Platelet -     Comprehensive metabolic panel with GFR -     Lisinopril ; Take 1 tablet (5 mg total) by mouth daily.  Dispense: 90 tablet; Refill: 1  Type 2 diabetes mellitus with hyperglycemia, without long-term current use of insulin (HCC) -     Hemoglobin A1c -     Microalbumin / creatinine urine ratio -     Ambulatory referral to Ophthalmology -     Semaglutide (0.25 or 0.5MG /DOS); Start 0.25mg  weekly  Dispense: 3 mL; Refill: 1 -     metFORMIN  HCl ER; Take 1 tablet (750 mg total) by mouth daily with breakfast.  Dispense: 90 tablet; Refill: 1  Depression, major, single episode, moderate (HCC) -     Ambulatory referral to Psychology -     Escitalopram  Oxalate; Take 1 tablet (10 mg total) by mouth daily.  Dispense: 90 tablet; Refill: 1  Screening for colorectal cancer -     Ambulatory referral to Gastroenterology  Screening mammogram for breast cancer -     3D Screening Mammogram, Left and Right; Future  Thyroid  nodule -     TSH -     VITAMIN D  25 Hydroxy (Vit-D Deficiency, Fractures) -     Ambulatory referral to Endocrinology  Need for pneumococcal 20-valent conjugate vaccination -     Pneumococcal conjugate vaccine 20-valent  Urinary urgency -     Solifenacin  Succinate; Take 1 tablet (5 mg total) by mouth daily.  Dispense: 90 tablet; Refill: 3 -     Urinalysis, Routine w reflex microscopic -     Urine Culture; Future  Hyperlipidemia LDL goal <100 -     Lipid panel -     Rosuvastatin  Calcium ; Take 1 tablet (20 mg total) by mouth daily.  Dispense: 90 tablet; Refill: 1   Assessment and Plan    Type 2 diabetes mellitus previously uncontrolled Poor self-management and medication non-adherence. Not checking blood sugars, out of medications including Ozempic . - Order blood work to check glucose levels. - Prescribe Ozempic . - Refer to  ophthalmology for diabetic eye exam.  Essential hypertension Medication non-adherence, not taken antihypertensive medication for months. - Prescribe antihypertensive medication.  Hyperlipidemia Confusion regarding current medication regimen, unclear if taking Lipitor or Crestor . - Order blood work to assess lipid profile. - Review and clarify current lipid-lowering medication regimen will restart Crestor   Obesity Recent weight gain, reports lack of motivation to cook and poor dietary habits. - Encourage healthy eating habits.  Major depressive disorder Exacerbated by recent life stressors, currently on Lexapro , may require dosage adjustment. No suicidal ideation. - Order psychotherapy referral. - Consider increasing Lexapro  dosage after further evaluation.  Stress urinary incontinence Increased frequency of nocturia, possible urinary tract infection indicated by burning sensation during urination. - Order urinalysis to check for urinary tract infection. - Prescribe medication for bladder control.  Impaired vision No recent eye exam, last exam a couple of years ago. - Refer to ophthalmology for comprehensive eye exam.  Nontoxic single thyroid  nodule Identified last year with no follow-up, no symptoms of dysphagia or choking. - Refer to endocrinology for further evaluation of thyroid  nodule.  General Health Maintenance Overdue for several health maintenance screenings, no recent pneumococcal vaccination. - Order 3D mammogram. - Order colonoscopy. - Administer pneumococcal vaccination.     Test results were reviewed and analyzed as part of the medical decision  making of this visit.  Recommend healthy diet i.e mediterranean/DASH diet, consistent exercise - 30 minutes 5 day per week, and gradual weight loss. RTC 4-6 weeks or sooner if nec.   Return in about 4 weeks (around 04/13/2024), or if symptoms worsen or fail to improve.   Subjective:    Patient ID: [name], female    DOB: 1956/01/05  Age: 68 y.o. MRN: [id number]  Chief Complaint  Patient presents with   Urinary Incontinence    Patient states she has been having trouble holding her urine if she has more than a glass of water, this has been going on for a couple of months    HPI Discussed the use of AI scribe software for clinical note transcription with the patient, who gave verbal consent to proceed.  History of Present Illness        [name] is a 68 year old female with type 2 diabetes, hypertension, hyperlipidemia, urinary urgency and depression who presents with concerns about her overall health and medication management. Patient is here to establish primary care. Patient not sure what she takes and does not know what she needs but thinks she needs most refills.   She feels depressed and overwhelmed due to recent life events, including moving and a series of break-ins at her current home. Her depression is ongoing and exacerbated by her current circumstances and divorce that occurred during COVID.  She is currently taking Lexapro , which provides some relief, but she is unsure if the dosage is adequate. She has not been in therapy but is open to the idea. No suicidal ideation is reported.  She has a history of diabetes but has not been monitoring her blood sugar levels recently. She is out of her diabetes medication, Ozempic , and has not been taking it.  patient was not sure about her other medications. She reports not eating healthily and lacking motivation to cook, contributing to her poor diabetes management. patient has not see ophthalmology in over 2 years.   She has a thyroid  nodule and has not followed up with any endocrinology appointments or had a biopsy. No difficulty swallowing or choking or hoarseness.   She has a history of hypertension but has not taken her blood pressure medication for months. She is unsure of her current medications and has not been  managing her prescriptions effectively.  She reports urinary incontinence, particularly when coughing or sneezing, and experiences nocturia, getting up four times a night, which disrupts her sleep.  She is concerned about her cholesterol medication, unsure if she is taking Lipitor or Crestor . Crestor  is listed as the most recent medication.   She has not had a mammogram in over a year and reports breast pain. She has had a colonoscopy in the past but is unsure of the results or when it was last done. She thinks she had polyps before.  She has a history of fibroids and had an endometrial biopsy in 2018, which was benign.  She has not had an eye exam in a couple of years and reports worsening vision. She has not been taking care of her health due to her depression and life circumstances.  She has a history of postmenopausal bleeding and an endometrial polyp, which was benign. She has not had a period since her fifties. Physical Exam NECK: Right thyroid  swelling. CHEST: Lungs clear to auscultation. Results PATHOLOGY Endometrial biopsy: Benign endometrial polyp, no malignancy (2018) Assessment & Plan Type 2 diabetes mellitus previously  uncontrolled Poor self-management and medication non-adherence. Not checking blood sugars, out of medications including Ozempic . - Order blood work to check glucose levels. - Prescribe Ozempic . - Refer to ophthalmology for diabetic eye exam.  Essential hypertension Medication non-adherence, not taken antihypertensive medication for months. - Prescribe antihypertensive medication.  Hyperlipidemia Confusion regarding current medication regimen, unclear if taking Lipitor or Crestor . - Order blood work to assess lipid profile. - Review and clarify current lipid-lowering medication regimen will restart Crestor   Obesity Recent weight gain, reports lack of motivation to cook and poor dietary habits. - Encourage healthy eating habits.  Major depressive  disorder Exacerbated by recent life stressors, currently on Lexapro , may require dosage adjustment. No suicidal ideation. - Order psychotherapy referral. - Consider increasing Lexapro  dosage after further evaluation.  Stress urinary incontinence Increased frequency of nocturia, possible urinary tract infection indicated by burning sensation during urination. - Order urinalysis to check for urinary tract infection. - Prescribe medication for bladder control.  Impaired vision No recent eye exam, last exam a couple of years ago. - Refer to ophthalmology for comprehensive eye exam.  Nontoxic single thyroid  nodule Identified last year with no follow-up, no symptoms of dysphagia or choking. - Refer to endocrinology for further evaluation of thyroid  nodule.  General Health Maintenance Overdue for several health maintenance screenings, no recent pneumococcal vaccination. - Order 3D mammogram. - Order colonoscopy. - Administer pneumococcal vaccination.    The 10-year ASCVD risk score (Arnett DK, et al., 2019) is: 34.5%  Past Medical History:  Diagnosis Date   Bilateral kidney stones 08/30/2017   Depression    Diabetes mellitus without complication (HCC)    Fatigue    Fibroids, intramural 06/12/2017   Has subserosal and submucosal and intramural fibroids    Hyperlipidemia    Hypertension    Snoring    Thickened endometrium 06/12/2017   Will get endo bx   Past Surgical History:  Procedure Laterality Date   CESAREAN SECTION     2   FOOT SURGERY     Social History   Tobacco Use   Smoking status: Never   Smokeless tobacco: Never  Vaping Use   Vaping status: Never Used  Substance Use Topics   Alcohol use: No    Alcohol/week: 0.0 standard drinks of alcohol   Drug use: No   Family History  Problem Relation Age of Onset   Kidney disease Mother 34       kidney failure   Hypertension Mother    Diabetes Mother    Arthritis Mother    Depression Mother    Alcohol abuse  Father    Cirrhosis Father    Early death Father 73   Diabetes Sister    Cancer Maternal Aunt        ovary   Cancer Maternal Grandfather        prostate   No Known Allergies  ROS    Objective:    BP (!) 154/82   Pulse 74   Temp 98.8 F (37.1 C)   Ht 5' (1.524 m)   Wt 179 lb 6.4 oz (81.4 kg)   SpO2 98%   BMI 35.04 kg/m  BP Readings from Last 3 Encounters:  03/16/24 (!) 154/82  02/22/24 131/80  12/05/23 (!) 154/87   Wt Readings from Last 3 Encounters:  03/16/24 179 lb 6.4 oz (81.4 kg)  02/22/24 191 lb 12.8 oz (87 kg)  12/05/23 190 lb 0.6 oz (86.2 kg)    Physical Exam Vitals and nursing note  reviewed.  Constitutional:      Appearance: Normal appearance.  HENT:     Head: Normocephalic.     Right Ear: Tympanic membrane, ear canal and external ear normal.     Left Ear: Tympanic membrane, ear canal and external ear normal.  Eyes:     Extraocular Movements: Extraocular movements intact.     Conjunctiva/sclera: Conjunctivae normal.     Pupils: Pupils are equal, round, and reactive to light.  Cardiovascular:     Rate and Rhythm: Normal rate and regular rhythm.     Heart sounds: Normal heart sounds.  Pulmonary:     Effort: Pulmonary effort is normal.     Breath sounds: Normal breath sounds.  Musculoskeletal:     Right lower leg: No edema.     Left lower leg: No edema.  Neurological:     General: No focal deficit present.     Mental Status: She is alert and oriented to person, place, and time.  Psychiatric:        Mood and Affect: Mood normal.        Behavior: Behavior normal.        Thought Content: Thought content normal.        Judgment: Judgment normal.    uin  No results found for any visits on 03/16/24.

## 2024-03-17 ENCOUNTER — Encounter (INDEPENDENT_AMBULATORY_CARE_PROVIDER_SITE_OTHER): Payer: Self-pay | Admitting: *Deleted

## 2024-03-17 ENCOUNTER — Ambulatory Visit: Payer: Self-pay | Admitting: Family Medicine

## 2024-03-17 LAB — CBC WITH DIFFERENTIAL/PLATELET
Absolute Lymphocytes: 1760 {cells}/uL (ref 850–3900)
Absolute Monocytes: 561 {cells}/uL (ref 200–950)
Basophils Absolute: 22 {cells}/uL (ref 0–200)
Basophils Relative: 0.4 %
Eosinophils Absolute: 61 {cells}/uL (ref 15–500)
Eosinophils Relative: 1.1 %
HCT: 40.3 % (ref 35.0–45.0)
Hemoglobin: 12.7 g/dL (ref 11.7–15.5)
MCH: 27.3 pg (ref 27.0–33.0)
MCHC: 31.5 g/dL — ABNORMAL LOW (ref 32.0–36.0)
MCV: 86.5 fL (ref 80.0–100.0)
MPV: 10.6 fL (ref 7.5–12.5)
Monocytes Relative: 10.2 %
Neutro Abs: 3097 {cells}/uL (ref 1500–7800)
Neutrophils Relative %: 56.3 %
Platelets: 273 Thousand/uL (ref 140–400)
RBC: 4.66 Million/uL (ref 3.80–5.10)
RDW: 13.2 % (ref 11.0–15.0)
Total Lymphocyte: 32 %
WBC: 5.5 Thousand/uL (ref 3.8–10.8)

## 2024-03-17 LAB — URINE CULTURE
MICRO NUMBER:: 16845721
Result:: NO GROWTH
SPECIMEN QUALITY:: ADEQUATE

## 2024-03-17 LAB — LIPID PANEL
Cholesterol: 199 mg/dL (ref <200)
HDL: 60 mg/dL (ref >=50)
LDL Cholesterol (Calc): 117 mg/dL — ABNORMAL HIGH
Non-HDL Cholesterol (Calc): 139 mg/dL — ABNORMAL HIGH (ref <130)
Total CHOL/HDL Ratio: 3.3 (calc) (ref <5.0)
Triglycerides: 111 mg/dL (ref <150)

## 2024-03-17 LAB — URINALYSIS, ROUTINE W REFLEX MICROSCOPIC
Bilirubin Urine: NEGATIVE
Hgb urine dipstick: NEGATIVE
Ketones, ur: NEGATIVE
Leukocytes,Ua: NEGATIVE
Nitrite: NEGATIVE
Protein, ur: NEGATIVE
Specific Gravity, Urine: 1.019 (ref 1.001–1.035)
pH: 6.5 (ref 5.0–8.0)

## 2024-03-17 LAB — COMPREHENSIVE METABOLIC PANEL WITH GFR
AG Ratio: 1.2 (calc) (ref 1.0–2.5)
ALT: 15 U/L (ref 6–29)
AST: 13 U/L (ref 10–35)
Albumin: 4.2 g/dL (ref 3.6–5.1)
Alkaline phosphatase (APISO): 77 U/L (ref 37–153)
BUN/Creatinine Ratio: 11 (calc) (ref 6–22)
BUN: 12 mg/dL (ref 7–25)
CO2: 29 mmol/L (ref 20–32)
Calcium: 9.5 mg/dL (ref 8.6–10.4)
Chloride: 103 mmol/L (ref 98–110)
Creat: 1.06 mg/dL — ABNORMAL HIGH (ref 0.50–1.05)
Globulin: 3.4 g/dL (ref 1.9–3.7)
Glucose, Bld: 259 mg/dL — ABNORMAL HIGH (ref 65–99)
Potassium: 3.9 mmol/L (ref 3.5–5.3)
Sodium: 139 mmol/L (ref 135–146)
Total Bilirubin: 0.4 mg/dL (ref 0.2–1.2)
Total Protein: 7.6 g/dL (ref 6.1–8.1)
eGFR: 57 mL/min/1.73m2 — ABNORMAL LOW (ref >=60)

## 2024-03-17 LAB — TSH: TSH: 0.42 m[IU]/L (ref 0.40–4.50)

## 2024-03-17 LAB — HEMOGLOBIN A1C
Hgb A1c MFr Bld: 9.8 % — ABNORMAL HIGH (ref <5.7)
Mean Plasma Glucose: 235 mg/dL
eAG (mmol/L): 13 mmol/L

## 2024-03-17 LAB — VITAMIN D 25 HYDROXY (VIT D DEFICIENCY, FRACTURES): Vit D, 25-Hydroxy: 12 ng/mL — ABNORMAL LOW (ref 30–100)

## 2024-03-17 LAB — MICROALBUMIN / CREATININE URINE RATIO
Creatinine, Urine: 64 mg/dL (ref 20–275)
Microalb Creat Ratio: 53 mg/g{creat} — ABNORMAL HIGH (ref <30)
Microalb, Ur: 3.4 mg/dL

## 2024-04-17 ENCOUNTER — Ambulatory Visit: Admitting: Family Medicine

## 2024-06-02 ENCOUNTER — Ambulatory Visit (INDEPENDENT_AMBULATORY_CARE_PROVIDER_SITE_OTHER): Admitting: Family Medicine

## 2024-06-02 ENCOUNTER — Encounter: Payer: Self-pay | Admitting: Family Medicine

## 2024-06-02 VITALS — BP 126/76 | HR 76 | Temp 98.5°F | Ht 60.0 in | Wt 177.0 lb

## 2024-06-02 DIAGNOSIS — E119 Type 2 diabetes mellitus without complications: Secondary | ICD-10-CM

## 2024-06-02 DIAGNOSIS — I10 Essential (primary) hypertension: Secondary | ICD-10-CM

## 2024-06-02 DIAGNOSIS — E785 Hyperlipidemia, unspecified: Secondary | ICD-10-CM

## 2024-06-02 DIAGNOSIS — G4709 Other insomnia: Secondary | ICD-10-CM

## 2024-06-02 DIAGNOSIS — F321 Major depressive disorder, single episode, moderate: Secondary | ICD-10-CM

## 2024-06-02 DIAGNOSIS — R6889 Other general symptoms and signs: Secondary | ICD-10-CM

## 2024-06-02 DIAGNOSIS — Z7984 Long term (current) use of oral hypoglycemic drugs: Secondary | ICD-10-CM

## 2024-06-02 DIAGNOSIS — Z1231 Encounter for screening mammogram for malignant neoplasm of breast: Secondary | ICD-10-CM

## 2024-06-02 DIAGNOSIS — Z23 Encounter for immunization: Secondary | ICD-10-CM

## 2024-06-02 MED ORDER — ESCITALOPRAM OXALATE 20 MG PO TABS: 20.0000 mg | ORAL_TABLET | Freq: Every day | ORAL | 1 refills | Status: DC

## 2024-06-02 MED ORDER — TRAZODONE HCL 50 MG PO TABS: 50.0000 mg | ORAL_TABLET | Freq: Every day | ORAL | 0 refills | Status: DC

## 2024-06-02 NOTE — Progress Notes (Unsigned)
 Patient Office Visit  Assessment & Plan:  Type 2 diabetes mellitus without complication, without long-term current use of insulin (HCC)  Essential hypertension  Hyperlipidemia LDL goal <100  Other insomnia -     traZODone HCl; Take 1 tablet (50 mg total) by mouth at bedtime. Take one hour prior to bedtime  Dispense: 90 tablet; Refill: 0  Depression, major, single episode, moderate (HCC) -     Escitalopram  Oxalate; Take 1 tablet (20 mg total) by mouth daily.  Dispense: 90 tablet; Refill: 1 -     AMB Referral [facility] Care Management  Screening mammogram for breast cancer -     3D Screening Mammogram, Left and Right; Future  Needs flu shot  Forgetfulness -     AMB Referral [facility] Care Management   Assessment and Plan    Depression Chronic depression with increased stressors. Current Lexapro  10 mg insufficient. - Increased Lexapro  to 20 mg daily.  Insomnia Chronic insomnia possibly exacerbated by stress and poor sleep hygiene. Uncertain trazodone use. - Prescribed trazodone for sleep. - Sent prescription to pharmacy.  Bladder dysfunction Uncertain current medication use. - Verify current use of bladder medication. Patient will start taking Vesicare   Essential hypertension Blood pressure well-controlled. Type 2 diabetes- previously uncontrolled. patient now taking medications.  Acute sinus infection Recovering with ongoing antibiotic treatment. - Continue current antibiotic regimen.  General Health Maintenance Received pneumonia vaccination. Uncertain flu shot status. Mammogram ordered but not completed. - Will administer flu shot after recovery from sinus infection. - Reordered mammogram in [place].     Reviewed her medications during the office visit.  Will postpone the flu shot until she recovers from the sinus infection.  We ordered the mammogram.  Also patient is aware of consultations previously ordered and information given today.  [facility] consult ordered to help  with medication management and other resources patient may need  Return in about 5 weeks (around 07/07/2024), or if symptoms worsen or fail to improve.   Subjective:    Patient ID: [name], female    DOB: 1956-07-07  Age: 68 y.o. MRN: [id number]  Chief Complaint  Patient presents with   Medical Management of Chronic Issues    HPI Discussed the use of AI scribe software for clinical note transcription with the patient, who gave verbal consent to proceed.  History of Present Illness        History of Present Illness [name] is a 68 year old female who presents with sleep disturbances and intermittent breast pain. patient is also here to follow up on chronic medical issues. Patient is still under a lot of stress and not always sure what medications she is taking or supposed to take-this was reviewed today  She experiences ongoing sleep disturbances, often finding herself awake throughout the night and disoriented due to lack of sleep. She is uncertain if she is currently taking trazodone, which was previously prescribed for sleep. She is on Lexapro  for depression. patient will doublecheck re the Trazodone and if not taking will start to do so.  Patient went through a lot of stress moving to her current living arrangements.  Patient states that people helped her move as she ended up taking her things and she lost a lot of furniture and other household items with the move.    She reports breast pain and is attempting to reschedule a mammogram that was ordered in August. She is unsure if she received a call to schedule the mammogram and  mentions difficulty answering the phone due to her circumstances. We reordered this today to be done in [place]  She is recovering from a sinus infection and is currently on antibiotics. She feels better but continues to experience some symptoms. Patient would like to postpone the flu shot today  She mentions bladder issues and is  uncertain if she is taking the prescribed medication for it. She plans to check her medications at home. We did review this today.   She describes a recent stressful period involving a move, during which she felt taken advantage of and unsupported by her family. This has contributed to her stress and impacted her mental health. She lives with her daughter who has special needs and has limited ability to assist her. Her other daughters live out of town, making it difficult for them to provide support.  Assessment and Plan Depression with increased stressors Chronic depression with increased stressors. Current Lexapro  10 mg insufficient. - Increased Lexapro  to 20 mg daily.  Insomnia Chronic insomnia possibly exacerbated by stress and poor sleep hygiene. Uncertain trazodone use. - Prescribed trazodone for sleep. - Sent prescription to pharmacy.  Bladder dysfunction Uncertain current medication use. - Verify current use of bladder medication. Patient will start taking Vesicare   Essential hypertension Blood pressure well-controlled. Type 2 diabetes- previously uncontrolled. patient now taking medications.  Acute sinus infection Recovering with ongoing antibiotic treatment. - Continue current antibiotic regimen. Hyperlipidemia-denies unusual muscle aches or muscle cramps or difficulty tolerating statin therapy.   General Health Maintenance Received pneumonia vaccination. Uncertain flu shot status. Mammogram ordered but not completed. - Will administer flu shot after recovery from sinus infection. - Reordered mammogram in [place].  The 10-year ASCVD risk score (Arnett DK, et al., 2019) is: 24.4%  Past Medical History:  Diagnosis Date   Bilateral kidney stones 08/30/2017   Depression    Diabetes mellitus without complication (HCC)    Fatigue    Fibroids, intramural 06/12/2017   Has subserosal and submucosal and intramural fibroids    Hyperlipidemia    Hypertension    Snoring     Thickened endometrium 06/12/2017   Will get endo bx   Past Surgical History:  Procedure Laterality Date   CESAREAN SECTION     2   FOOT SURGERY     Social History   Tobacco Use   Smoking status: Never   Smokeless tobacco: Never  Vaping Use   Vaping status: Never Used  Substance Use Topics   Alcohol use: No    Alcohol/week: 0.0 standard drinks of alcohol   Drug use: No   Family History  Problem Relation Age of Onset   Kidney disease Mother 23       kidney failure   Hypertension Mother    Diabetes Mother    Arthritis Mother    Depression Mother    Alcohol abuse Father    Cirrhosis Father    Early death Father 48   Diabetes Sister    Cancer Maternal Aunt        ovary   Cancer Maternal Grandfather        prostate   Allergies  Allergen Reactions   Metformin  Rash    ROS    Objective:    BP 126/76   Pulse 76   Temp 98.5 F (36.9 C)   Ht 5' (1.524 m)   Wt 177 lb (80.3 kg)   SpO2 99%   BMI 34.57 kg/m  BP Readings from Last 3 Encounters:  06/02/24 126/76  03/16/24 (!) 154/82  02/22/24 131/80   Wt Readings from Last 3 Encounters:  06/02/24 177 lb (80.3 kg)  03/16/24 179 lb 6.4 oz (81.4 kg)  02/22/24 191 lb 12.8 oz (87 kg)    Physical Exam Vitals and nursing note reviewed.  Constitutional:      General: She is not in acute distress.    Appearance: Normal appearance.  HENT:     Head: Normocephalic.     Right Ear: Tympanic membrane, ear canal and external ear normal.     Left Ear: Tympanic membrane, ear canal and external ear normal.  Eyes:     Extraocular Movements: Extraocular movements intact.     Conjunctiva/sclera: Conjunctivae normal.     Pupils: Pupils are equal, round, and reactive to light.  Cardiovascular:     Rate and Rhythm: Normal rate and regular rhythm.     Heart sounds: Normal heart sounds.  Pulmonary:     Effort: Pulmonary effort is normal.     Breath sounds: Normal breath sounds.  Musculoskeletal:     Right lower leg: No  edema.     Left lower leg: No edema.  Neurological:     General: No focal deficit present.     Mental Status: She is alert and oriented to person, place, and time.  Psychiatric:        Attention and Perception: She is inattentive.        Mood and Affect: Mood is anxious and depressed.        Speech: Speech normal.        Behavior: Behavior normal.        Thought Content: Thought content normal.        Judgment: Judgment normal.      Results for orders placed or performed in visit on 06/02/24  HM COLONOSCOPY  Result Value Ref Range   HM Colonoscopy See Report (in chart) See Report (in chart), Patient Reported

## 2024-06-03 ENCOUNTER — Encounter: Payer: Self-pay | Admitting: Family Medicine

## 2024-07-07 ENCOUNTER — Ambulatory Visit: Admitting: Family Medicine

## 2024-07-07 ENCOUNTER — Telehealth: Payer: Self-pay

## 2024-07-07 NOTE — Progress Notes (Unsigned)
 Complex Care Management Note Care Guide Note  07/07/2024 Name: [name] MRN: [id number] DOB: 1955/09/18   Complex Care Management Outreach Attempts: An unsuccessful telephone outreach was attempted today to offer the patient information about available complex care management services.  Follow Up Plan:  Additional outreach attempts will be made to offer the patient complex care management information and services.   Encounter Outcome:  No Answer  .Debbe Fuse [facility], [facility] Guide  Direct Dial: [phone]  Fax [phone]

## 2024-07-08 NOTE — Progress Notes (Signed)
 Complex Care Management Note Care Guide Note  07/08/2024 Name: [name] MRN: [id number] DOB: 1956/02/18   Complex Care Management Outreach Attempts: A second unsuccessful outreach was attempted today to offer the patient with information about available complex care management services.  Follow Up Plan:  Additional outreach attempts will be made to offer the patient complex care management information and services.   Encounter Outcome:  No Answer  .Debbe Fuse [facility], [facility] Guide  Direct Dial: [phone]  Fax [phone]

## 2024-07-09 NOTE — Progress Notes (Signed)
 Complex Care Management Note Care Guide Note  07/09/2024 Name: [name] MRN: [id number] DOB: 1956/04/25   Complex Care Management Outreach Attempts: A third unsuccessful outreach was attempted today to offer the patient with information about available complex care management services.  Follow Up Plan:  No further outreach attempts will be made at this time. We have been unable to contact the patient to offer or enroll patient in complex care management services.  Encounter Outcome:  No Answer  .Debbe Fuse [facility], [facility] Guide  Direct Dial: [phone]  Fax [phone]

## 2024-07-24 ENCOUNTER — Ambulatory Visit: Admitting: Family Medicine

## 2024-08-05 ENCOUNTER — Other Ambulatory Visit (HOSPITAL_COMMUNITY): Payer: Self-pay | Admitting: Family Medicine

## 2024-08-05 DIAGNOSIS — Z1231 Encounter for screening mammogram for malignant neoplasm of breast: Secondary | ICD-10-CM

## 2024-08-10 ENCOUNTER — Ambulatory Visit: Admitting: Family Medicine

## 2024-08-10 ENCOUNTER — Telehealth: Payer: Self-pay

## 2024-08-10 NOTE — Telephone Encounter (Signed)
 NTBS to discuss. Pt has appt on 1/21.

## 2024-08-10 NOTE — Telephone Encounter (Signed)
 Copied from CRM [id number]. Topic: Referral - Request for Referral >> Aug 10, 2024 11:17 AM [name] wrote: Did the patient discuss referral with their provider in the last year? yes  Appointment offered? No  Type of order/referral and detailed reason for visit:Endocrinologist, Thyroid   Preference of office, provider, location:  ENDOCRINOLOGY [address] [place] [address] [phone]  If referral order, have you been seen by this specialty before? no  Can we respond through MyChart? No Call [name] when referral completed: [phone]

## 2024-08-12 ENCOUNTER — Encounter (HOSPITAL_COMMUNITY): Payer: Self-pay

## 2024-08-12 ENCOUNTER — Ambulatory Visit (HOSPITAL_COMMUNITY)
Admission: RE | Admit: 2024-08-12 | Discharge: 2024-08-12 | Disposition: A | Discharge status: Home / Self Care | Source: Ambulatory Visit | Attending: Family Medicine | Admitting: Family Medicine

## 2024-08-12 DIAGNOSIS — Z1231 Encounter for screening mammogram for malignant neoplasm of breast: Secondary | ICD-10-CM | POA: Insufficient documentation

## 2024-08-19 ENCOUNTER — Ambulatory Visit: Admitting: Family Medicine

## 2024-08-25 ENCOUNTER — Ambulatory Visit: Admitting: Family Medicine

## 2024-09-01 ENCOUNTER — Encounter: Payer: Self-pay | Admitting: Physician Assistant

## 2024-09-01 ENCOUNTER — Encounter: Payer: Self-pay | Admitting: Family Medicine

## 2024-09-01 ENCOUNTER — Telehealth: Payer: Self-pay

## 2024-09-01 ENCOUNTER — Ambulatory Visit: Admitting: Family Medicine

## 2024-09-01 VITALS — BP 146/90 | HR 83 | Ht 60.0 in | Wt 177.0 lb

## 2024-09-01 DIAGNOSIS — Z7985 Long-term (current) use of injectable non-insulin antidiabetic drugs: Secondary | ICD-10-CM | POA: Diagnosis not present

## 2024-09-01 DIAGNOSIS — F321 Major depressive disorder, single episode, moderate: Secondary | ICD-10-CM | POA: Diagnosis not present

## 2024-09-01 DIAGNOSIS — I10 Essential (primary) hypertension: Secondary | ICD-10-CM | POA: Diagnosis not present

## 2024-09-01 DIAGNOSIS — R3915 Urgency of urination: Secondary | ICD-10-CM | POA: Diagnosis not present

## 2024-09-01 DIAGNOSIS — E785 Hyperlipidemia, unspecified: Secondary | ICD-10-CM

## 2024-09-01 DIAGNOSIS — E1165 Type 2 diabetes mellitus with hyperglycemia: Secondary | ICD-10-CM | POA: Diagnosis not present

## 2024-09-01 DIAGNOSIS — R413 Other amnesia: Secondary | ICD-10-CM | POA: Diagnosis not present

## 2024-09-01 DIAGNOSIS — Z7984 Long term (current) use of oral hypoglycemic drugs: Secondary | ICD-10-CM | POA: Diagnosis not present

## 2024-09-01 DIAGNOSIS — G4709 Other insomnia: Secondary | ICD-10-CM | POA: Diagnosis not present

## 2024-09-01 MED ORDER — GLIPIZIDE 5 MG PO TABS: 2.5000 mg | ORAL_TABLET | Freq: Every day | ORAL | 0 refills | Status: AC

## 2024-09-01 MED ORDER — ESCITALOPRAM OXALATE 20 MG PO TABS: 20.0000 mg | ORAL_TABLET | Freq: Every day | ORAL | 1 refills | Status: AC

## 2024-09-01 MED ORDER — ROSUVASTATIN CALCIUM 20 MG PO TABS: 20.0000 mg | ORAL_TABLET | Freq: Every day | ORAL | 1 refills | Status: AC

## 2024-09-01 MED ORDER — SOLIFENACIN SUCCINATE 5 MG PO TABS: 5.0000 mg | ORAL_TABLET | Freq: Every day | ORAL | 3 refills | Status: AC

## 2024-09-01 MED ORDER — TRAZODONE HCL 50 MG PO TABS: 50.0000 mg | ORAL_TABLET | Freq: Every day | ORAL | 0 refills | Status: AC

## 2024-09-01 MED ORDER — LISINOPRIL 5 MG PO TABS: 5.0000 mg | ORAL_TABLET | Freq: Every day | ORAL | 1 refills | Status: AC

## 2024-09-01 MED ORDER — OZEMPIC (0.25 OR 0.5 MG/DOSE) 2 MG/3ML ~~LOC~~ SOPN: 0.5000 mg | PEN_INJECTOR | SUBCUTANEOUS | 1 refills | Status: AC

## 2024-09-02 ENCOUNTER — Ambulatory Visit: Payer: Self-pay | Admitting: Family Medicine

## 2024-09-02 LAB — COMPREHENSIVE METABOLIC PANEL WITH GFR
AG Ratio: 1.1 (calc) (ref 1.0–2.5)
ALT: 15 U/L (ref 6–29)
AST: 12 U/L (ref 10–35)
Albumin: 4 g/dL (ref 3.6–5.1)
Alkaline phosphatase (APISO): 69 U/L (ref 37–153)
BUN: 13 mg/dL (ref 7–25)
CO2: 28 mmol/L (ref 20–32)
Calcium: 10 mg/dL (ref 8.6–10.4)
Chloride: 103 mmol/L (ref 98–110)
Creat: 0.99 mg/dL (ref 0.60–1.00)
Globulin: 3.6 g/dL (ref 1.9–3.7)
Glucose, Bld: 277 mg/dL — ABNORMAL HIGH (ref 65–99)
Potassium: 4 mmol/L (ref 3.5–5.3)
Sodium: 140 mmol/L (ref 135–146)
Total Bilirubin: 0.4 mg/dL (ref 0.2–1.2)
Total Protein: 7.6 g/dL (ref 6.1–8.1)
eGFR: 61 mL/min/{1.73_m2}

## 2024-09-02 LAB — CBC WITH DIFFERENTIAL/PLATELET
Absolute Lymphocytes: 1166 {cells}/uL (ref 850–3900)
Absolute Monocytes: 446 {cells}/uL (ref 200–950)
Basophils Absolute: 22 {cells}/uL (ref 0–200)
Basophils Relative: 0.4 %
Eosinophils Absolute: 28 {cells}/uL (ref 15–500)
Eosinophils Relative: 0.5 %
HCT: 38.8 % (ref 35.9–46.0)
Hemoglobin: 12.8 g/dL (ref 11.7–15.5)
MCH: 28 pg (ref 27.0–33.0)
MCHC: 33 g/dL (ref 31.6–35.4)
MCV: 84.9 fL (ref 81.4–101.7)
MPV: 10.7 fL (ref 7.5–12.5)
Monocytes Relative: 8.1 %
Neutro Abs: 3839 {cells}/uL (ref 1500–7800)
Neutrophils Relative %: 69.8 %
Platelets: 293 10*3/uL (ref 140–400)
RBC: 4.57 Million/uL (ref 3.80–5.10)
RDW: 13.2 % (ref 11.0–15.0)
Total Lymphocyte: 21.2 %
WBC: 5.5 10*3/uL (ref 3.8–10.8)

## 2024-09-02 LAB — HEMOGLOBIN A1C
Hgb A1c MFr Bld: 8.8 % — ABNORMAL HIGH
Mean Plasma Glucose: 206 mg/dL
eAG (mmol/L): 11.4 mmol/L

## 2024-09-02 LAB — LIPID PANEL
Cholesterol: 210 mg/dL — ABNORMAL HIGH
HDL: 62 mg/dL
LDL Cholesterol (Calc): 125 mg/dL — ABNORMAL HIGH
Non-HDL Cholesterol (Calc): 148 mg/dL — ABNORMAL HIGH
Total CHOL/HDL Ratio: 3.4 (calc)
Triglycerides: 119 mg/dL

## 2024-09-02 LAB — TSH: TSH: 0.61 m[IU]/L (ref 0.40–4.50)

## 2024-09-02 NOTE — Telephone Encounter (Signed)
 Pt daughter informed and verbalized understanding.

## 2024-09-09 ENCOUNTER — Ambulatory Visit: Admitting: "Endocrinology

## 2024-10-16 ENCOUNTER — Ambulatory Visit: Payer: Self-pay

## 2024-10-16 ENCOUNTER — Ambulatory Visit: Payer: Self-pay | Admitting: Physician Assistant

## 2024-12-01 ENCOUNTER — Ambulatory Visit: Admitting: Family Medicine
